# Patient Record
Sex: Female | Born: 1965 | Race: White | Hispanic: No | Marital: Married | Smoking: Former smoker
Health system: Southern US, Community
[De-identification: ages and names within clinical notes are randomized; demographics above are authoritative.]

## PROBLEM LIST (undated history)

## (undated) DIAGNOSIS — K219 Gastro-esophageal reflux disease without esophagitis: Secondary | ICD-10-CM

## (undated) DIAGNOSIS — H9191 Unspecified hearing loss, right ear: Secondary | ICD-10-CM

## (undated) DIAGNOSIS — M1990 Unspecified osteoarthritis, unspecified site: Secondary | ICD-10-CM

## (undated) DIAGNOSIS — E039 Hypothyroidism, unspecified: Secondary | ICD-10-CM

## (undated) HISTORY — PX: TONSILLECTOMY: SUR1361

---

## 1993-01-14 HISTORY — PX: CHOLECYSTECTOMY: SHX55

## 2008-08-02 ENCOUNTER — Emergency Department: Payer: Self-pay | Admitting: Emergency Medicine

## 2008-08-03 ENCOUNTER — Inpatient Hospital Stay: Payer: Self-pay | Admitting: Internal Medicine

## 2008-08-23 ENCOUNTER — Ambulatory Visit: Payer: Self-pay

## 2011-04-18 ENCOUNTER — Other Ambulatory Visit (HOSPITAL_COMMUNITY): Payer: Self-pay | Admitting: Family Medicine

## 2011-04-18 DIAGNOSIS — Z1231 Encounter for screening mammogram for malignant neoplasm of breast: Secondary | ICD-10-CM

## 2011-04-23 ENCOUNTER — Ambulatory Visit (HOSPITAL_COMMUNITY): Payer: Self-pay

## 2012-06-18 ENCOUNTER — Ambulatory Visit: Payer: Medicaid Other

## 2012-06-23 ENCOUNTER — Ambulatory Visit: Payer: Medicaid Other

## 2012-10-13 ENCOUNTER — Other Ambulatory Visit: Payer: Self-pay | Admitting: Orthopedic Surgery

## 2012-10-14 ENCOUNTER — Telehealth: Payer: Self-pay | Admitting: Vascular Surgery

## 2012-10-14 NOTE — Telephone Encounter (Signed)
LVM re: spine films and appt, sent letter - kf

## 2012-10-14 NOTE — Telephone Encounter (Signed)
Message copied by Margaretmary Eddy on Wed Oct 14, 2012  2:19 PM ------      Message from: Phillips Odor      Created: Wed Oct 14, 2012 12:15 PM      Regarding: needs consult appt. with TFE       Please schedule this pt. for consult appt. with Dr. Arbie Cookey; needs appt. prior to scheduled ALIF on 10/22 ; please remind pt. to bring CD ROM of the L-S spine films to the appt. with her.  ------

## 2012-10-22 ENCOUNTER — Other Ambulatory Visit: Payer: Self-pay

## 2012-10-26 ENCOUNTER — Encounter (HOSPITAL_COMMUNITY): Payer: Self-pay | Admitting: Pharmacy Technician

## 2012-10-27 ENCOUNTER — Encounter (HOSPITAL_COMMUNITY)
Admission: RE | Admit: 2012-10-27 | Discharge: 2012-10-27 | Disposition: A | Discharge status: Home / Self Care | Payer: Medicaid Other | Source: Ambulatory Visit | Attending: Orthopedic Surgery | Admitting: Orthopedic Surgery

## 2012-10-27 ENCOUNTER — Encounter (HOSPITAL_COMMUNITY): Payer: Self-pay

## 2012-10-27 DIAGNOSIS — Z01818 Encounter for other preprocedural examination: Secondary | ICD-10-CM | POA: Insufficient documentation

## 2012-10-27 DIAGNOSIS — Z01810 Encounter for preprocedural cardiovascular examination: Secondary | ICD-10-CM | POA: Insufficient documentation

## 2012-10-27 DIAGNOSIS — Z01812 Encounter for preprocedural laboratory examination: Secondary | ICD-10-CM | POA: Insufficient documentation

## 2012-10-27 HISTORY — DX: Unspecified osteoarthritis, unspecified site: M19.90

## 2012-10-27 HISTORY — DX: Unspecified hearing loss, right ear: H91.91

## 2012-10-27 HISTORY — DX: Hypothyroidism, unspecified: E03.9

## 2012-10-27 HISTORY — DX: Gastro-esophageal reflux disease without esophagitis: K21.9

## 2012-10-27 LAB — URINALYSIS, ROUTINE W REFLEX MICROSCOPIC
Bilirubin Urine: NEGATIVE
Glucose, UA: NEGATIVE mg/dL
Ketones, ur: NEGATIVE mg/dL
Leukocytes, UA: NEGATIVE
Nitrite: NEGATIVE
Protein, ur: NEGATIVE mg/dL
Specific Gravity, Urine: 1.018 (ref 1.005–1.030)
Urobilinogen, UA: 0.2 mg/dL (ref 0.0–1.0)
pH: 5 (ref 5.0–8.0)

## 2012-10-27 LAB — BASIC METABOLIC PANEL
CO2: 24 mEq/L (ref 19–32)
Calcium: 9.8 mg/dL (ref 8.4–10.5)
Creatinine, Ser: 0.71 mg/dL (ref 0.50–1.10)
GFR calc Af Amer: >90 mL/min (ref >90)
GFR calc non Af Amer: >90 mL/min (ref >90)
Glucose, Bld: 111 mg/dL — ABNORMAL HIGH (ref 70–99)
Sodium: 140 mEq/L (ref 135–145)

## 2012-10-27 LAB — PROTIME-INR
INR: 0.94 (ref 0.00–1.49)
Prothrombin Time: 12.4 s (ref 11.6–15.2)

## 2012-10-27 LAB — CBC
HCT: 42.2 % (ref 36.0–46.0)
Hemoglobin: 14.1 g/dL (ref 12.0–15.0)
MCH: 29.4 pg (ref 26.0–34.0)
MCHC: 33.4 g/dL (ref 30.0–36.0)
MCV: 88.1 fL (ref 78.0–100.0)
Platelets: 249 10*3/uL (ref 150–400)
RBC: 4.79 MIL/uL (ref 3.87–5.11)
WBC: 7.7 10*3/uL (ref 4.0–10.5)

## 2012-10-27 LAB — URINE MICROSCOPIC-ADD ON

## 2012-10-27 LAB — TYPE AND SCREEN
ABO/RH(D): O POS
Antibody Screen: NEGATIVE

## 2012-10-27 LAB — ABO/RH: ABO/RH(D): O POS

## 2012-10-27 LAB — APTT: aPTT: 34 seconds (ref 24–37)

## 2012-10-27 LAB — SURGICAL PCR SCREEN
MRSA, PCR: NEGATIVE
Staphylococcus aureus: POSITIVE — AB

## 2012-10-27 NOTE — Pre-Procedure Instructions (Signed)
Daniele Dillow  10/27/2012   Your procedure is scheduled on:  Wednesday, October 22nd.   Report to [facility]  2 * 3 at 6:30 AM.             [facility])  Call this number if you have problems the morning of surgery: [phone]   Remember:   Do not eat food or drink liquids after midnight Tuesday.   Take these medicines the morning of surgery with A SIP OF WATER: Norvasc, Levothyroxine, Ranitidine, Tramadol   Do not wear jewelry, make-up or nail polish.  Do not wear lotions, powders, or perfumes. You may wear deodorant.  Do not shave underarms & legs 48 hours prior to surgery.    Do not bring valuables to the hospital.  [facility] is not responsible for any belongings or valuables.               Contacts, dentures or bridgework may not be worn into surgery.  Leave suitcase in the car. After surgery it may be brought to your room.  For patients admitted to the hospital, discharge time is determined by your treatment team.              Name and phone number of your driver:    Special Instructions: Shower using CHG 2 nights before surgery and the night before surgery.  If you shower the day of surgery use CHG.  Use special wash - you have one bottle of CHG for all showers.  You should use approximately 1/3 of the bottle for each shower.   Please read over the following fact sheets that you were given: Coughing and Deep Breathing, Blood Transfusion Information, MRSA Information and Surgical Site Infection Prevention

## 2012-10-27 NOTE — Progress Notes (Addendum)
Denies any cardiac problem.   Did have shingles awhile ago.Marland KitchenMarland KitchenShe is deaf in right ear.  DA She goes to [employer] [phone] .  I tried calling with no one answering...Marland KitchenDA Trying to get an EKG for comparison.........still no answer @ 1617.

## 2012-10-28 NOTE — Progress Notes (Signed)
Anesthesia Chart Review:  Patient is a 47 year old female scheduled for L4-5 ALIF with Dr. Yevette Edwards and Dr. Arbie Cookey on 11/04/12.  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 PCP is with [facility].  Meds: amlodipine, Isopto tears, Synthroid, Zantac, Mobic, Ultram.  EKG on 10/27/12 showed NSR, possible LAe, incomplete right BB, inferior infarct (age undetermined), cannot rule out anterior infarct (age undetermined).  There are no prior EKGs at [facility] or in [place].  CXR report on 10/27/12 showed: Lungs are clear. Heart size and pulmonary vascularity are normal. No adenopathy. No bone lesions. There are surgical clips in the right upper quadrant of the abdomen.   Preoperative labs noted.  I reviewed above with EKG with anesthesiologist Dr. Gypsy Balsam.  Patient is 47 year old who is morbidly obese with a smoking history.  She is without known CAD/MI, CHF, DM, or HTN history.  She denied any cardiac issues, chest pain at her PAT visit. If there is no acute changes or new CV symptomology then it is anticipated that she can proceed as planned.    Velna Ochs [facility] Short Stay Center/Anesthesiology Phone [phone] 10/28/2012 4:49 PM

## 2012-11-02 ENCOUNTER — Encounter: Payer: Self-pay | Admitting: Vascular Surgery

## 2012-11-03 ENCOUNTER — Encounter (INDEPENDENT_AMBULATORY_CARE_PROVIDER_SITE_OTHER): Payer: Self-pay

## 2012-11-03 ENCOUNTER — Encounter: Payer: Self-pay | Admitting: Vascular Surgery

## 2012-11-03 ENCOUNTER — Ambulatory Visit (INDEPENDENT_AMBULATORY_CARE_PROVIDER_SITE_OTHER): Payer: Medicaid Other | Admitting: Vascular Surgery

## 2012-11-03 VITALS — BP 124/75 | HR 73 | Temp 97.7°F | Resp 16 | Ht 67.0 in | Wt 263.0 lb

## 2012-11-03 DIAGNOSIS — Z01810 Encounter for preprocedural cardiovascular examination: Secondary | ICD-10-CM

## 2012-11-03 MED ORDER — CEFAZOLIN SODIUM-DEXTROSE 2-3 GM-% IV SOLR
2.0000 g | INTRAVENOUS | Status: AC
  Administered 2012-11-04 (×2): 2 g via INTRAVENOUS
  Filled 2012-11-03: qty 50

## 2012-11-03 NOTE — Progress Notes (Signed)
[facility] [place]   Patient name: Ebony Reeves MRN: [id number] DOB: 07-23-1965 Sex: female   Referred by: Yevette Edwards  Reason for referral:  Chief Complaint  Patient presents with  . New Evaluation    Pre-op  for ALIF C/O back pain-19 yrs getting worse    HISTORY OF PRESENT ILLNESS: The patient presents today for discussion of spine surgery. She is a 47 year old female with a long history of progressive degenerative disc disease. She has been evaluated and felt appropriate for anterior exposure for interbody fusion and then the posterior instrumentation. She is here today for discussion of my role for exposure for anterior fusion. She has had a prior laparoscopic cholecystectomy in 1995 otherwise no abdominal surgery.  Past Medical History  Diagnosis Date  . GERD (gastroesophageal reflux disease)   . Arthritis   . Hypothyroidism   . Deafness in right ear     shingles & Ramsay Hunt Syndrome    Past Surgical History  Procedure Laterality Date  . Tonsillectomy    . Cholecystectomy  1995    Gall Bladder    History   Social History  . Marital Status: Legally Separated    Spouse Name: N/A    Number of Children: N/A  . Years of Education: N/A   Occupational History  . Not on file.   Social History Main Topics  . Smoking status: Former Smoker -- 0.50 packs/day for 25 years    Types: Cigarettes    Quit date: 11/03/2012  . Smokeless tobacco: Never Used  . Alcohol Use: No  . Drug Use: No  . Sexual Activity: Yes   Other Topics Concern  . Not on file   Social History Narrative  . No narrative on file    Family History  Problem Relation Age of Onset  . Cancer Mother     Bladder  . Diabetes Mother   . Hypertension Mother   . Hyperlipidemia Father   . Hypertension Father   . Diabetes Brother     Allergies as of 11/03/2012  . (No Known Allergies)    Current Outpatient Prescriptions on File Prior to Visit  Medication Sig Dispense Refill   . amLODipine (NORVASC) 10 MG tablet Take 10 mg by mouth daily.      . hydroxypropyl methylcellulose (ISOPTO TEARS) 2.5 % ophthalmic solution Place 2 drops into both eyes at bedtime as needed (for dry eyes).      Marland Kitchen levothyroxine (SYNTHROID, LEVOTHROID) 100 MCG tablet Take 100 mcg by mouth daily before breakfast.      . ranitidine (ZANTAC) 150 MG tablet Take 150 mg by mouth 2 (two) times daily.      . traMADol (ULTRAM) 50 MG tablet Take 50 mg by mouth 2 (two) times daily.      . meloxicam (MOBIC) 15 MG tablet Take 15 mg by mouth daily.       Current Facility-Administered Medications on File Prior to Visit  Medication Dose Route Frequency Provider Last Rate Last Dose  . [START ON 11/04/2012] ceFAZolin (ANCEF) IVPB 2 g/50 mL premix  2 g Intravenous On Call to OR Emilee Hero, MD         REVIEW OF SYSTEMS:  Positives indicated with an "X"  CARDIOVASCULAR:  [ ]  chest pain   [ ]  chest pressure   [ ]  palpitations   [ ]  orthopnea   [ ]  dyspnea on exertion   [ ]  claudication   [ ]  rest pain   [ ]   DVT   [ ]  phlebitis PULMONARY:   [ ]  productive cough   [ ]  asthma   [ ]  wheezing NEUROLOGIC:   [x ] weakness  [x ] paresthesias  [ ]  aphasia  [ ]  amaurosis  [ ]  dizziness HEMATOLOGIC:   [ ]  bleeding problems   [ ]  clotting disorders MUSCULOSKELETAL:  [ ]  joint pain   [ ]  joint swelling GASTROINTESTINAL: [ ]   blood in stool  [ ]   hematemesis GENITOURINARY:  [ ]   dysuria  [ ]   hematuria PSYCHIATRIC:  [ ]  history of major depression INTEGUMENTARY:  [ ]  rashes  [ ]  ulcers CONSTITUTIONAL:  [ ]  fever   [ ]  chills  PHYSICAL EXAMINATION:  General: The patient is a well-nourished female, in no acute distress. Vital signs are BP 124/75  Pulse 73  Temp(Src) 97.7 F (36.5 C) (Oral)  Resp 16  Ht 5\' 7"  (1.702 m)  Wt 263 lb (119.296 kg)  BMI 41.18 kg/m2  SpO2 99%  LMP 10/27/2008 Pulmonary: There is a good air exchange bilaterally without wheezing or rales. Abdomen: Soft and non-tender with  normal pitch bowel sounds. Moderate obesity Musculoskeletal: There are no major deformities.  There is no significant extremity pain. Neurologic: No focal weakness or paresthesias are detected, Skin: There are no ulcer or rashes noted. Psychiatric: The patient has normal affect. Cardiovascular: There is a regular rate and rhythm without significant murmur appreciated. Pulse status 2+ radial and 2+ dorsalis pedis pulses bilaterally   Impression and Plan:  L4-5 degenerative disc disease with plan for anterior and posterior fixation. I explained my role in exposure with mobilization of intraperitoneal contents, left ureter and vena cava aorta and iliac mobilization. I explained the potential for injury of all these. She understands and wished to proceed with the surgery as scheduled    Radhika Dershem [facility] [place] Office: [phone]

## 2012-11-04 ENCOUNTER — Inpatient Hospital Stay (HOSPITAL_COMMUNITY): Payer: Medicaid Other

## 2012-11-04 ENCOUNTER — Encounter (HOSPITAL_COMMUNITY): Payer: Self-pay | Admitting: Anesthesiology

## 2012-11-04 ENCOUNTER — Encounter (HOSPITAL_COMMUNITY)
Admission: RE | Disposition: A | Discharge status: Home / Self Care | Payer: Medicaid Other | Source: Ambulatory Visit | Attending: Orthopedic Surgery

## 2012-11-04 ENCOUNTER — Inpatient Hospital Stay (HOSPITAL_COMMUNITY)
Admission: RE | Admit: 2012-11-04 | Discharge: 2012-11-07 | DRG: 454 | Disposition: A | Discharge status: Home / Self Care | Payer: Medicaid Other | Source: Ambulatory Visit | Attending: Orthopedic Surgery | Admitting: Orthopedic Surgery

## 2012-11-04 ENCOUNTER — Inpatient Hospital Stay (HOSPITAL_COMMUNITY): Payer: Medicaid Other | Admitting: Anesthesiology

## 2012-11-04 ENCOUNTER — Encounter (HOSPITAL_COMMUNITY): Payer: Medicaid Other | Admitting: Vascular Surgery

## 2012-11-04 DIAGNOSIS — Q762 Congenital spondylolisthesis: Secondary | ICD-10-CM

## 2012-11-04 DIAGNOSIS — M129 Arthropathy, unspecified: Secondary | ICD-10-CM | POA: Diagnosis present

## 2012-11-04 DIAGNOSIS — M51379 Other intervertebral disc degeneration, lumbosacral region without mention of lumbar back pain or lower extremity pain: Principal | ICD-10-CM | POA: Diagnosis present

## 2012-11-04 DIAGNOSIS — M4310 Spondylolisthesis, site unspecified: Secondary | ICD-10-CM

## 2012-11-04 DIAGNOSIS — E039 Hypothyroidism, unspecified: Secondary | ICD-10-CM | POA: Diagnosis present

## 2012-11-04 DIAGNOSIS — Z87891 Personal history of nicotine dependence: Secondary | ICD-10-CM

## 2012-11-04 DIAGNOSIS — K219 Gastro-esophageal reflux disease without esophagitis: Secondary | ICD-10-CM | POA: Diagnosis present

## 2012-11-04 DIAGNOSIS — Z6841 Body Mass Index (BMI) 40.0 and over, adult: Secondary | ICD-10-CM

## 2012-11-04 DIAGNOSIS — M5137 Other intervertebral disc degeneration, lumbosacral region: Principal | ICD-10-CM | POA: Diagnosis present

## 2012-11-04 DIAGNOSIS — Z981 Arthrodesis status: Secondary | ICD-10-CM

## 2012-11-04 HISTORY — PX: ANTERIOR AND POSTERIOR SPINAL FUSION: SHX2259

## 2012-11-04 HISTORY — PX: ABDOMINAL EXPOSURE: SHX5708

## 2012-11-04 SURGERY — ANTERIOR AND POSTERIOR SPINAL FUSION
Anesthesia: General | Site: Spine Lumbar | Wound class: Clean

## 2012-11-04 MED ORDER — LEVOTHYROXINE SODIUM 100 MCG PO TABS
100.0000 ug | ORAL_TABLET | Freq: Every day | ORAL | Status: DC
  Administered 2012-11-05 – 2012-11-07 (×3): 100 ug via ORAL
  Filled 2012-11-04 (×4): qty 1

## 2012-11-04 MED ORDER — PROMETHAZINE HCL 25 MG/ML IJ SOLN
6.2500 mg | INTRAMUSCULAR | Status: DC | PRN
  Administered 2012-11-04: 6.25 mg via INTRAVENOUS

## 2012-11-04 MED ORDER — DIAZEPAM 5 MG PO TABS
ORAL_TABLET | ORAL | Status: AC
  Administered 2012-11-04: 5 mg via ORAL
  Filled 2012-11-04: qty 1

## 2012-11-04 MED ORDER — DEXTROSE 5 % IV SOLN
INTRAVENOUS | Status: DC | PRN
  Administered 2012-11-04: 14:00:00 via INTRAVENOUS

## 2012-11-04 MED ORDER — AMLODIPINE BESYLATE 10 MG PO TABS
10.0000 mg | ORAL_TABLET | Freq: Every day | ORAL | Status: DC
  Administered 2012-11-06 – 2012-11-07 (×2): 10 mg via ORAL
  Filled 2012-11-04 (×3): qty 1

## 2012-11-04 MED ORDER — DIPHENHYDRAMINE HCL 12.5 MG/5ML PO ELIX: 12.5000 mg | ORAL_SOLUTION | Freq: Four times a day (QID) | ORAL | Status: DC | PRN

## 2012-11-04 MED ORDER — MEPERIDINE HCL 25 MG/ML IJ SOLN: 6.2500 mg | INTRAMUSCULAR | Status: DC | PRN

## 2012-11-04 MED ORDER — ROCURONIUM BROMIDE 100 MG/10ML IV SOLN
INTRAVENOUS | Status: DC | PRN
  Administered 2012-11-04: 20 mg via INTRAVENOUS
  Administered 2012-11-04: 10 mg via INTRAVENOUS
  Administered 2012-11-04: 20 mg via INTRAVENOUS
  Administered 2012-11-04: 40 mg via INTRAVENOUS

## 2012-11-04 MED ORDER — OXYCODONE-ACETAMINOPHEN 5-325 MG PO TABS
1.0000 | ORAL_TABLET | ORAL | Status: DC | PRN
  Administered 2012-11-04: 2 via ORAL
  Administered 2012-11-05 (×2): 1 via ORAL
  Administered 2012-11-05 – 2012-11-07 (×7): 2 via ORAL
  Filled 2012-11-04 (×3): qty 2
  Filled 2012-11-04 (×2): qty 1
  Filled 2012-11-04 (×4): qty 2

## 2012-11-04 MED ORDER — SENNA 8.6 MG PO TABS
1.0000 | ORAL_TABLET | Freq: Two times a day (BID) | ORAL | Status: DC
Start: 2012-11-04 — End: 2012-11-07
  Administered 2012-11-04 – 2012-11-07 (×6): 8.6 mg via ORAL
  Filled 2012-11-04 (×8): qty 1

## 2012-11-04 MED ORDER — HYPROMELLOSE (GONIOSCOPIC) 2.5 % OP SOLN
2.0000 [drp] | Freq: Every evening | OPHTHALMIC | Status: DC | PRN
  Filled 2012-11-04: qty 15

## 2012-11-04 MED ORDER — PHENOL 1.4 % MT LIQD: 1.0000 | OROMUCOSAL | Status: DC | PRN

## 2012-11-04 MED ORDER — SODIUM CHLORIDE 0.9 % IJ SOLN: 3.0000 mL | INTRAMUSCULAR | Status: DC | PRN

## 2012-11-04 MED ORDER — MORPHINE SULFATE 2 MG/ML IJ SOLN: 2.0000 mg | INTRAMUSCULAR | Status: DC | PRN

## 2012-11-04 MED ORDER — HYDROMORPHONE HCL PF 1 MG/ML IJ SOLN
INTRAMUSCULAR | Status: AC
  Administered 2012-11-04: 0.5 mg via INTRAVENOUS
  Filled 2012-11-04: qty 1

## 2012-11-04 MED ORDER — DEXTROSE 5 % IV SOLN
2.0000 g | Freq: Once | INTRAVENOUS | Status: DC
  Filled 2012-11-04: qty 2000

## 2012-11-04 MED ORDER — MORPHINE SULFATE (PF) 1 MG/ML IV SOLN
INTRAVENOUS | Status: DC
  Administered 2012-11-04: 19.5 mg via INTRAVENOUS
  Administered 2012-11-04: 16:00:00 via INTRAVENOUS
  Administered 2012-11-04: 3.7 mg via INTRAVENOUS
  Administered 2012-11-05: 10.5 mg via INTRAVENOUS
  Filled 2012-11-04: qty 25

## 2012-11-04 MED ORDER — SODIUM CHLORIDE 0.9 % IJ SOLN: 9.0000 mL | INTRAMUSCULAR | Status: DC | PRN

## 2012-11-04 MED ORDER — SODIUM CHLORIDE 0.9 % IJ SOLN
2000.0000 ug | INTRAVENOUS | Status: DC | PRN
  Administered 2012-11-04: .5 ug/kg/min via INTRAVENOUS

## 2012-11-04 MED ORDER — SODIUM CHLORIDE 0.9 % IJ SOLN
3.0000 mL | Freq: Two times a day (BID) | INTRAMUSCULAR | Status: DC
  Administered 2012-11-04 – 2012-11-07 (×4): 3 mL via INTRAVENOUS

## 2012-11-04 MED ORDER — ONDANSETRON HCL 4 MG/2ML IJ SOLN: 4.0000 mg | Freq: Four times a day (QID) | INTRAMUSCULAR | Status: DC | PRN

## 2012-11-04 MED ORDER — MENTHOL 3 MG MT LOZG: 1.0000 | LOZENGE | OROMUCOSAL | Status: DC | PRN

## 2012-11-04 MED ORDER — ACETAMINOPHEN 650 MG RE SUPP: 650.0000 mg | RECTAL | Status: DC | PRN

## 2012-11-04 MED ORDER — LACTATED RINGERS IV SOLN
INTRAVENOUS | Status: DC | PRN
  Administered 2012-11-04 (×2): via INTRAVENOUS

## 2012-11-04 MED ORDER — DOCUSATE SODIUM 100 MG PO CAPS
100.0000 mg | ORAL_CAPSULE | Freq: Two times a day (BID) | ORAL | Status: DC
  Administered 2012-11-04 – 2012-11-07 (×6): 100 mg via ORAL
  Filled 2012-11-04 (×4): qty 1

## 2012-11-04 MED ORDER — THROMBIN 20000 UNITS EX SOLR
CUTANEOUS | Status: DC | PRN
  Administered 2012-11-04: 10:00:00

## 2012-11-04 MED ORDER — DIAZEPAM 5 MG PO TABS
5.0000 mg | ORAL_TABLET | Freq: Four times a day (QID) | ORAL | Status: DC | PRN
  Administered 2012-11-04 – 2012-11-07 (×8): 5 mg via ORAL
  Filled 2012-11-04 (×7): qty 1

## 2012-11-04 MED ORDER — MIDAZOLAM HCL 5 MG/5ML IJ SOLN
INTRAMUSCULAR | Status: DC | PRN
  Administered 2012-11-04: 1.5 mg via INTRAVENOUS
  Administered 2012-11-04: 0.5 mg via INTRAVENOUS

## 2012-11-04 MED ORDER — BUPIVACAINE-EPINEPHRINE 0.25% -1:200000 IJ SOLN
INTRAMUSCULAR | Status: DC | PRN
  Administered 2012-11-04: 24 mL

## 2012-11-04 MED ORDER — BUPIVACAINE-EPINEPHRINE PF 0.25-1:200000 % IJ SOLN
INTRAMUSCULAR | Status: AC
  Filled 2012-11-04: qty 30

## 2012-11-04 MED ORDER — ONDANSETRON HCL 4 MG/2ML IJ SOLN
INTRAMUSCULAR | Status: DC | PRN
  Administered 2012-11-04: 4 mg via INTRAVENOUS

## 2012-11-04 MED ORDER — ALUM & MAG HYDROXIDE-SIMETH 200-200-20 MG/5ML PO SUSP
30.0000 mL | Freq: Four times a day (QID) | ORAL | Status: DC | PRN
  Administered 2012-11-06: 30 mL via ORAL
  Filled 2012-11-04 (×2): qty 30

## 2012-11-04 MED ORDER — NEOSTIGMINE METHYLSULFATE 1 MG/ML IJ SOLN
INTRAMUSCULAR | Status: DC | PRN
  Administered 2012-11-04: 4 mg via INTRAVENOUS

## 2012-11-04 MED ORDER — ACETAMINOPHEN 325 MG PO TABS: 650.0000 mg | ORAL_TABLET | ORAL | Status: DC | PRN

## 2012-11-04 MED ORDER — PHENYLEPHRINE HCL 10 MG/ML IJ SOLN
10.0000 mg | INTRAVENOUS | Status: DC | PRN
  Administered 2012-11-04: 20 ug/min via INTRAVENOUS

## 2012-11-04 MED ORDER — LACTATED RINGERS IV SOLN
INTRAVENOUS | Status: DC | PRN
  Administered 2012-11-04 (×2): via INTRAVENOUS

## 2012-11-04 MED ORDER — SODIUM CHLORIDE 0.9 % IV SOLN: 250.0000 mL | INTRAVENOUS | Status: DC

## 2012-11-04 MED ORDER — POLYVINYL ALCOHOL 1.4 % OP SOLN
2.0000 [drp] | Freq: Every evening | OPHTHALMIC | Status: DC | PRN
  Administered 2012-11-04: 2 [drp] via OPHTHALMIC
  Filled 2012-11-04: qty 15

## 2012-11-04 MED ORDER — PHENYLEPHRINE HCL 10 MG/ML IJ SOLN
INTRAMUSCULAR | Status: DC | PRN
  Administered 2012-11-04 (×3): 40 ug via INTRAVENOUS

## 2012-11-04 MED ORDER — THROMBIN 20000 UNITS EX SOLR
CUTANEOUS | Status: AC
  Filled 2012-11-04: qty 40000

## 2012-11-04 MED ORDER — HYDROMORPHONE HCL PF 1 MG/ML IJ SOLN
0.2500 mg | INTRAMUSCULAR | Status: DC | PRN
  Administered 2012-11-04 (×4): 0.5 mg via INTRAVENOUS

## 2012-11-04 MED ORDER — FENTANYL CITRATE 0.05 MG/ML IJ SOLN
INTRAMUSCULAR | Status: DC | PRN
  Administered 2012-11-04 (×2): 50 ug via INTRAVENOUS
  Administered 2012-11-04: 100 ug via INTRAVENOUS
  Administered 2012-11-04: 50 ug via INTRAVENOUS
  Administered 2012-11-04: 100 ug via INTRAVENOUS

## 2012-11-04 MED ORDER — CHLORHEXIDINE GLUCONATE 4 % EX LIQD: 60.0000 mL | Freq: Once | CUTANEOUS | Status: DC

## 2012-11-04 MED ORDER — PROMETHAZINE HCL 25 MG/ML IJ SOLN
INTRAMUSCULAR | Status: AC
  Administered 2012-11-04: 6.25 mg via INTRAVENOUS
  Filled 2012-11-04: qty 1

## 2012-11-04 MED ORDER — GLYCOPYRROLATE 0.2 MG/ML IJ SOLN
INTRAMUSCULAR | Status: DC | PRN
  Administered 2012-11-04: 0.2 mg via INTRAVENOUS
  Administered 2012-11-04: 0.6 mg via INTRAVENOUS

## 2012-11-04 MED ORDER — MORPHINE SULFATE (PF) 1 MG/ML IV SOLN
INTRAVENOUS | Status: AC
  Filled 2012-11-04: qty 25

## 2012-11-04 MED ORDER — LIDOCAINE HCL (CARDIAC) 20 MG/ML IV SOLN
INTRAVENOUS | Status: DC | PRN
  Administered 2012-11-04: 100 mg via INTRAVENOUS

## 2012-11-04 MED ORDER — OXYCODONE-ACETAMINOPHEN 5-325 MG PO TABS
ORAL_TABLET | ORAL | Status: AC
  Administered 2012-11-04: 2 via ORAL
  Filled 2012-11-04: qty 2

## 2012-11-04 MED ORDER — CEFAZOLIN SODIUM 1-5 GM-% IV SOLN
1.0000 g | Freq: Three times a day (TID) | INTRAVENOUS | Status: AC
  Administered 2012-11-04 – 2012-11-05 (×2): 1 g via INTRAVENOUS
  Filled 2012-11-04 (×2): qty 50

## 2012-11-04 MED ORDER — FAMOTIDINE 20 MG PO TABS
20.0000 mg | ORAL_TABLET | Freq: Every day | ORAL | Status: DC
  Administered 2012-11-05 – 2012-11-07 (×3): 20 mg via ORAL
  Filled 2012-11-04 (×3): qty 1

## 2012-11-04 MED ORDER — ONDANSETRON HCL 4 MG/2ML IJ SOLN: 4.0000 mg | INTRAMUSCULAR | Status: DC | PRN

## 2012-11-04 MED ORDER — NALOXONE HCL 0.4 MG/ML IJ SOLN: 0.4000 mg | INTRAMUSCULAR | Status: DC | PRN

## 2012-11-04 MED ORDER — POTASSIUM CHLORIDE IN NACL 20-0.9 MEQ/L-% IV SOLN
INTRAVENOUS | Status: DC
  Administered 2012-11-04: 20:00:00 via INTRAVENOUS
  Filled 2012-11-04 (×7): qty 1000

## 2012-11-04 MED ORDER — DEXTROSE 5 % IV SOLN
INTRAVENOUS | Status: DC | PRN
  Administered 2012-11-04: 09:00:00 via INTRAVENOUS

## 2012-11-04 MED ORDER — PROPOFOL INFUSION 10 MG/ML OPTIME
INTRAVENOUS | Status: DC | PRN
  Administered 2012-11-04: 25 ug/kg/min via INTRAVENOUS

## 2012-11-04 MED ORDER — SUCCINYLCHOLINE CHLORIDE 20 MG/ML IJ SOLN
INTRAMUSCULAR | Status: DC | PRN
  Administered 2012-11-04: 110 mg via INTRAVENOUS

## 2012-11-04 MED ORDER — DIPHENHYDRAMINE HCL 50 MG/ML IJ SOLN: 12.5000 mg | Freq: Four times a day (QID) | INTRAMUSCULAR | Status: DC | PRN

## 2012-11-04 MED ORDER — PROPOFOL 10 MG/ML IV BOLUS
INTRAVENOUS | Status: DC | PRN
  Administered 2012-11-04: 30 mg via INTRAVENOUS
  Administered 2012-11-04: 250 mg via INTRAVENOUS

## 2012-11-04 SURGICAL SUPPLY — 121 items
APPLIER CLIP 11 MED OPEN (CLIP)
BENZOIN TINCTURE PRP APPL 2/3 (GAUZE/BANDAGES/DRESSINGS) ×6 IMPLANT
BIT DRILL QC 3.2 195 (BIT)
BIT DRILL QC 3.2 195MM (BIT) IMPLANT
BLADE SURG 10 STRL SS (BLADE) ×3 IMPLANT
BLADE SURG ROTATE 9660 (MISCELLANEOUS) IMPLANT
CAGE COUGAR ALIF MED 5 10 (Cage) ×3 IMPLANT
CLIP APPLIE 11 MED OPEN (CLIP) IMPLANT
CLIP LIGATING EXTRA MED SLVR (CLIP) ×3 IMPLANT
CLIP LIGATING EXTRA SM BLUE (MISCELLANEOUS) ×3 IMPLANT
CLOTH BEACON ORANGE TIMEOUT ST (SAFETY) ×3 IMPLANT
CLSR STERI-STRIP ANTIMIC 1/2X4 (GAUZE/BANDAGES/DRESSINGS) ×6 IMPLANT
CONT SPEC STER OR (MISCELLANEOUS) ×3 IMPLANT
CORDS BIPOLAR (ELECTRODE) ×6 IMPLANT
COUNTER NEEDLE 20 DBL MAG RED (NEEDLE) ×3 IMPLANT
COVER MAYO STAND STRL (DRAPES) IMPLANT
COVER SURGICAL LIGHT HANDLE (MISCELLANEOUS) ×6 IMPLANT
DERMABOND ADVANCED (GAUZE/BANDAGES/DRESSINGS) ×1
DERMABOND ADVANCED .7 DNX12 (GAUZE/BANDAGES/DRESSINGS) ×2 IMPLANT
DRAIN CHANNEL 15F RND FF W/TCR (WOUND CARE) IMPLANT
DRAPE C-ARM 42X72 X-RAY (DRAPES) ×9 IMPLANT
DRAPE INCISE IOBAN 66X45 STRL (DRAPES) ×9 IMPLANT
DRAPE POUCH INSTRU U-SHP 10X18 (DRAPES) ×6 IMPLANT
DRAPE PROXIMA HALF (DRAPES) ×3 IMPLANT
DRAPE SURG 17X23 STRL (DRAPES) ×21 IMPLANT
DRILL BIT QC 3.2 195MM (BIT)
DRSG MEPILEX BORDER 4X8 (GAUZE/BANDAGES/DRESSINGS) ×3 IMPLANT
DURAPREP 26ML APPLICATOR (WOUND CARE) ×6 IMPLANT
ELECT BLADE 4.0 EZ CLEAN MEGAD (MISCELLANEOUS) ×3
ELECT CAUTERY BLADE 6.4 (BLADE) ×6 IMPLANT
ELECT REM PT RETURN 9FT ADLT (ELECTROSURGICAL) ×6
ELECTRODE BLDE 4.0 EZ CLN MEGD (MISCELLANEOUS) ×2 IMPLANT
ELECTRODE REM PT RTRN 9FT ADLT (ELECTROSURGICAL) ×4 IMPLANT
EVACUATOR 1/8 PVC DRAIN (DRAIN) IMPLANT
GAUZE SPONGE 4X4 16PLY XRAY LF (GAUZE/BANDAGES/DRESSINGS) ×9 IMPLANT
GLOVE BIO SURGEON STRL SZ 6.5 (GLOVE) ×3 IMPLANT
GLOVE BIO SURGEON STRL SZ7 (GLOVE) ×6 IMPLANT
GLOVE BIO SURGEON STRL SZ8 (GLOVE) ×6 IMPLANT
GLOVE BIOGEL PI IND STRL 6.5 (GLOVE) ×2 IMPLANT
GLOVE BIOGEL PI IND STRL 8 (GLOVE) ×2 IMPLANT
GLOVE BIOGEL PI INDICATOR 6.5 (GLOVE) ×1
GLOVE BIOGEL PI INDICATOR 8 (GLOVE) ×1
GLOVE SS BIOGEL STRL SZ 7.5 (GLOVE) ×6 IMPLANT
GLOVE SUPERSENSE BIOGEL SZ 7.5 (GLOVE) ×3
GOWN PREVENTION PLUS XLARGE (GOWN DISPOSABLE) ×12 IMPLANT
GOWN STRL NON-REIN LRG LVL3 (GOWN DISPOSABLE) ×21 IMPLANT
GUIDEWIRE SHARP VIPER II (WIRE) ×12 IMPLANT
INSERT FOGARTY 61MM (MISCELLANEOUS) IMPLANT
INSERT FOGARTY SM (MISCELLANEOUS) IMPLANT
KIT BASIN OR (CUSTOM PROCEDURE TRAY) ×3 IMPLANT
KIT POSITION SURG JACKSON T1 (MISCELLANEOUS) ×3 IMPLANT
KIT ROOM TURNOVER OR (KITS) ×6 IMPLANT
LOOP VESSEL MAXI BLUE (MISCELLANEOUS) IMPLANT
LOOP VESSEL MINI RED (MISCELLANEOUS) IMPLANT
MARKER SKIN DUAL TIP RULER LAB (MISCELLANEOUS) ×3 IMPLANT
MIX DBX 10CC 35% BONE (Bone Implant) ×3 IMPLANT
NEEDLE 22X1 1/2 (OR ONLY) (NEEDLE) ×3 IMPLANT
NEEDLE HYPO 25GX1X1/2 BEV (NEEDLE) ×6 IMPLANT
NEEDLE JAMSHIDI VIPER (NEEDLE) ×9 IMPLANT
NEEDLE SPNL 18GX3.5 QUINCKE PK (NEEDLE) IMPLANT
NEEDLE SPNL 22GX3.5 QUINCKE BK (NEEDLE) ×3 IMPLANT
NEURO MONITORING STIM (LABOR (TRAVEL & OVERTIME)) ×3 IMPLANT
NS IRRIG 1000ML POUR BTL (IV SOLUTION) ×3 IMPLANT
PACK LAMINECTOMY ORTHO (CUSTOM PROCEDURE TRAY) ×3 IMPLANT
PACK UNIVERSAL I (CUSTOM PROCEDURE TRAY) ×6 IMPLANT
PAD ARMBOARD 7.5X6 YLW CONV (MISCELLANEOUS) ×12 IMPLANT
PAD SHARPS MAGNETIC DISPOSAL (MISCELLANEOUS) IMPLANT
PATTIES SURGICAL .5 X1 (DISPOSABLE) IMPLANT
PATTIES SURGICAL .75X.75 (GAUZE/BANDAGES/DRESSINGS) IMPLANT
PENCIL BUTTON HOLSTER BLD 10FT (ELECTRODE) ×6 IMPLANT
ROD VIPER II LORDOSED 5.5X40 (Rod) ×3 IMPLANT
ROD VIPER2 5.5X45 PRE LARDOSED (Rod) ×3 IMPLANT
SCREW SET SINGLE INNER MIS (Screw) ×12 IMPLANT
SCREW XTAB POLY VIPER  6X45 (Screw) ×4 IMPLANT
SCREW XTAB POLY VIPER 6X45 (Screw) ×8 IMPLANT
SPONGE GAUZE 4X4 12PLY (GAUZE/BANDAGES/DRESSINGS) ×6 IMPLANT
SPONGE INTESTINAL PEANUT (DISPOSABLE) ×12 IMPLANT
SPONGE LAP 18X18 X RAY DECT (DISPOSABLE) ×3 IMPLANT
SPONGE LAP 4X18 X RAY DECT (DISPOSABLE) ×3 IMPLANT
SPONGE SURGIFOAM ABS GEL 100 (HEMOSTASIS) ×3 IMPLANT
STAPLER VISISTAT 35W (STAPLE) IMPLANT
STRIP CLOSURE SKIN 1/2X4 (GAUZE/BANDAGES/DRESSINGS) ×12 IMPLANT
SURGIFLO TRUKIT (HEMOSTASIS) IMPLANT
SUT ETHILON 2 0 FS 18 (SUTURE) ×3 IMPLANT
SUT MNCRL AB 4-0 PS2 18 (SUTURE) ×3 IMPLANT
SUT MON AB 3-0 SH 27 (SUTURE) ×1
SUT MON AB 3-0 SH27 (SUTURE) ×2 IMPLANT
SUT PROLENE 4 0 RB 1 (SUTURE) ×4
SUT PROLENE 4-0 RB1 .5 CRCL 36 (SUTURE) ×8 IMPLANT
SUT PROLENE 5 0 CC1 (SUTURE) ×6 IMPLANT
SUT PROLENE 6 0 C 1 30 (SUTURE) ×3 IMPLANT
SUT PROLENE 6 0 CC (SUTURE) IMPLANT
SUT SILK 0 TIES 10X30 (SUTURE) ×3 IMPLANT
SUT SILK 2 0 TIES 10X30 (SUTURE) ×6 IMPLANT
SUT SILK 2 0SH CR/8 30 (SUTURE) IMPLANT
SUT SILK 3 0 TIES 10X30 (SUTURE) ×6 IMPLANT
SUT SILK 3 0SH CR/8 30 (SUTURE) IMPLANT
SUT VIC AB 0 CT1 18XCR BRD 8 (SUTURE) ×4 IMPLANT
SUT VIC AB 0 CT1 27 (SUTURE) ×2
SUT VIC AB 0 CT1 27XBRD ANBCTR (SUTURE) ×4 IMPLANT
SUT VIC AB 0 CT1 8-18 (SUTURE) ×2
SUT VIC AB 1 CT1 18XCR BRD 8 (SUTURE) ×8 IMPLANT
SUT VIC AB 1 CT1 8-18 (SUTURE) ×4
SUT VIC AB 1 CTX 36 (SUTURE) ×2
SUT VIC AB 1 CTX36XBRD ANBCTR (SUTURE) ×4 IMPLANT
SUT VIC AB 2-0 CT1 18 (SUTURE) ×3 IMPLANT
SUT VIC AB 2-0 CT1 36 (SUTURE) ×3 IMPLANT
SUT VIC AB 2-0 CT2 18 VCP726D (SUTURE) ×3 IMPLANT
SUT VIC AB 3-0 SH 27 (SUTURE) ×1
SUT VIC AB 3-0 SH 27X BRD (SUTURE) ×2 IMPLANT
SUT VIC AB 3-0 SH 27XBRD (SUTURE) IMPLANT
SYR BULB IRRIGATION 50ML (SYRINGE) ×3 IMPLANT
SYR CONTROL 10ML LL (SYRINGE) ×6 IMPLANT
TAP CANN VIPER2 DL 5.0 (TAP) ×6 IMPLANT
TAPE CLOTH SURG 4X10 WHT LF (GAUZE/BANDAGES/DRESSINGS) ×3 IMPLANT
TAPE CLOTH SURG 6X10 WHT LF (GAUZE/BANDAGES/DRESSINGS) ×3 IMPLANT
TOWEL OR 17X24 6PK STRL BLUE (TOWEL DISPOSABLE) ×6 IMPLANT
TOWEL OR 17X26 10 PK STRL BLUE (TOWEL DISPOSABLE) ×9 IMPLANT
TRAY FOLEY CATH 14FRSI W/METER (CATHETERS) ×3 IMPLANT
WATER STERILE IRR 1000ML POUR (IV SOLUTION) ×3 IMPLANT
YANKAUER SUCT BULB TIP NO VENT (SUCTIONS) ×3 IMPLANT

## 2012-11-04 NOTE — Preoperative (Signed)
Beta Blockers   Reason not to administer Beta Blockers:Not Applicable 

## 2012-11-04 NOTE — Op Note (Signed)
OPERATIVE REPORT  DATE OF SURGERY: 11/04/2012  PATIENT: Ebony Reeves, 47 y.o. female MRN: [id number]  DOB: July 21, 1965  PRE-OPERATIVE DIAGNOSIS: L4-5 degenerative disc disease  POST-OPERATIVE DIAGNOSIS:  Same  PROCEDURE: Anterior exposure for L4-5 interbody fusion  SURGEON:  Gretta Began, M.D.  Co-surgeon for the exposure: Estill Bamberg  ANESTHESIA:  Gen.  EBL: 250 ml  Total I/O In: 1050 [I.V.:1050] Out: 435 [Urine:185; Blood:250]  BLOOD ADMINISTERED: None  DRAINS: None   COUNTS CORRECT:  YES  PLAN OF CARE: PACU   PATIENT DISPOSITION:  PACU - hemodynamically stable  PROCEDURE DETAILS: The patient was taken down and placed supine position where the area of the abdomen was imaged with cross table C-arm. This revealed the level of the L4-5 disc. The abdomen was prepped and draped in usual sterile fashion. Incision was made from midline to the left. The patient is morbidly obese and had a great deal of subcutaneous fat. This was divided with cautery in line with the skin incision. The fascia was reached and the fat was mobilized off the fascia. The fascia was opened electrocautery in line with the skin incision in the rectus muscle was mobilized circumferentially. The retroperitoneal space was opened laterally and dissection bluntly was used to mobilize intraperitoneal contents to the right. The left ureter was identified and preserved. Dissection was carried to the psoas muscle. Mobilization continued in the posterior rectus sheath was opened laterally at the level of the semilunar line and proximally. This allowed for better I continued mobilization of intraperitoneal contents to the right. The iliac vessels on the left were identified and the patient had 2 iliolumbar veins which were divided after ligating them. Blunt dissection was used to mobilize the iliac vessels to the right. The L4-5 disc was identified. Blunt dissection was continued to fully mobilize the space to the right and  left of the disc. The Thompson retractor was brought onto the field and the reverse lip 200 cm blade was positioned to the left and right of the L4-5 disc. The malleable 190 cm blade was positioned superiorly and inferiorly for exposure. The remainder of the procedure will be dictated as a separate note by Dr. Yevette Edwards.   Gretta Began, M.D. 11/04/2012 1:58 PM

## 2012-11-04 NOTE — Progress Notes (Signed)
Bair hugger applied. Warm blankets wrapped around pt.'s head.

## 2012-11-04 NOTE — Progress Notes (Addendum)
Pt arrived on unit at 1815 from pacu via bed with family in room waiting on pt. Pt A&O x4, c/o coldness and warm blanket and heat packs given; foley intact and draining. PCA going with pt pain at 1. Incision dry and intact  With no stain or drainage noted. Pt offered the PNA and FLU vaccine but says she will have to think about. Pt presently comfortable in bed with family at bedside. Will continue to monitor quietly; Incoming RN notified. Rodell Perna Dovey Fatzinger RN.

## 2012-11-04 NOTE — Anesthesia Preprocedure Evaluation (Addendum)
Anesthesia Evaluation  Patient identified by MRN, date of birth, ID band Patient awake    Reviewed: Allergy & Precautions, H&P , NPO status , Patient's Chart, lab work & pertinent test results  Airway Mallampati: II TM Distance: >3 FB Neck ROM: Full    Dental  (+) Dental Advisory Given   Pulmonary neg pulmonary ROS,  breath sounds clear to auscultation        Cardiovascular hypertension, Pt. 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 Some R sided facial weakness negative neurological ROS  negative psych ROS   GI/Hepatic Neg liver ROS, GERD-  Medicated,  Endo/Other  Hypothyroidism Morbid obesity  Renal/GU negative Renal ROS     Musculoskeletal negative musculoskeletal ROS (+)   Abdominal (+) + obese,   Peds  Hematology negative hematology ROS (+)   Anesthesia Other Findings   Reproductive/Obstetrics negative OB ROS                          Anesthesia Physical Anesthesia Plan  ASA: III  Anesthesia Plan: General   Post-op Pain Management:    Induction: Intravenous  Airway Management Planned: Oral ETT  Additional Equipment: Arterial line  Intra-op Plan:   Post-operative Plan: Extubation in OR  Informed Consent: I have reviewed the patients History and Physical, chart, labs and discussed the procedure including the risks, benefits and alternatives for the proposed anesthesia with the patient or authorized representative who has indicated his/her understanding and acceptance.   Dental advisory given  Plan Discussed with: CRNA  Anesthesia Plan Comments: (2x PIV)       Anesthesia Quick Evaluation

## 2012-11-04 NOTE — H&P (Signed)
PREOPERATIVE H&P  Chief Complaint: bilateral leg pain  HPI: Ebony Reeves is a 46 y.o. female who presents with ongoing bilateral leg pain. 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 Patient failed extensive conservative care.  Past Medical History  Diagnosis Date  . GERD (gastroesophageal reflux disease)   . Arthritis   . Hypothyroidism   . Deafness in right ear     shingles & Ramsay Hunt Syndrome   Past Surgical History  Procedure Laterality Date  . Tonsillectomy    . Cholecystectomy  1995    Gall Bladder   History   Social History  . Marital Status: Legally Separated    Spouse Name: N/A    Number of Children: N/A  . Years of Education: N/A   Social History Main Topics  . Smoking status: Former Smoker -- 0.50 packs/day for 25 years    Types: Cigarettes    Quit date: 11/03/2012  . Smokeless tobacco: Never Used  . Alcohol Use: No  . Drug Use: No  . Sexual Activity: Yes   Other Topics Concern  . Not on file   Social History Narrative  . No narrative on file   Family History  Problem Relation Age of Onset  . Cancer Mother     Bladder  . Diabetes Mother   . Hypertension Mother   . Hyperlipidemia Father   . Hypertension Father   . Diabetes Brother    No Known Allergies Prior to Admission medications   Medication Sig Start Date End Date Taking? Authorizing Provider  amLODipine (NORVASC) 10 MG tablet Take 10 mg by mouth daily.   Yes Historical Provider, MD  hydroxypropyl methylcellulose (ISOPTO TEARS) 2.5 % ophthalmic solution Place 2 drops into both eyes at bedtime as needed (for dry eyes).   Yes Historical Provider, MD  levothyroxine (SYNTHROID, LEVOTHROID) 100 MCG tablet Take 100 mcg by mouth daily before breakfast.   Yes Historical Provider, MD  meloxicam (MOBIC) 15 MG tablet Take 15 mg by mouth daily.   Yes Historical Provider, MD  ranitidine (ZANTAC) 150 MG tablet Take 150 mg by mouth 2 (two) times daily.   Yes Historical  Provider, MD  traMADol (ULTRAM) 50 MG tablet Take 50 mg by mouth 2 (two) times daily.   Yes Historical Provider, MD     All other systems have been reviewed and were otherwise negative with the exception of those mentioned in the HPI and as above.  Physical Exam: Filed Vitals:   11/04/12 0659  BP: 155/91  Pulse: 85  Temp: 96.9 F (36.1 C)  Resp: 18    General: Alert, no acute distress Cardiovascular: No pedal edema Respiratory: No cyanosis, no use of accessory musculature GI: No organomegaly, abdomen is soft and non-tender Skin: No lesions in the area of chief complaint Neurologic: Sensation intact distally Psychiatric: Patient is competent for consent with normal mood and affect Lymphatic: No axillary or cervical lymphadenopathy   Assessment/Plan: Bilateral leg pain Plan for Procedure(s): Lumbar 4-5 anterior lumbar interbody fusion with instrumentation and allograft; Posterior spinal fusion with instrumentation   Emilee Hero, MD 11/04/2012 8:10 AM

## 2012-11-04 NOTE — Anesthesia Postprocedure Evaluation (Signed)
Anesthesia Post Note  Patient: Ebony Reeves  Procedure(s) Performed: Procedure(s) (LRB): Lumbar 4-5 anterior lumbar interbody fusion with instrumentation and allograft; Posterior spinal fusion with instrumentation (N/A) ABDOMINAL EXPOSURE (N/A)  Anesthesia type: General  Patient location: PACU  Post pain: Pain level controlled  Post assessment: Post-op Vital signs reviewed  Last Vitals: BP 130/64  Pulse 62  Temp(Src) 33.9 C (Oral)  Resp 16  SpO2 100%  Post vital signs: Reviewed  Level of consciousness: sedated  Complications: No apparent anesthesia complications

## 2012-11-04 NOTE — Transfer of Care (Signed)
Immediate Anesthesia Transfer of Care Note  Patient: Ebony Reeves  Procedure(s) Performed: Procedure(s) with comments: Lumbar 4-5 anterior lumbar interbody fusion with instrumentation and allograft; Posterior spinal fusion with instrumentation (N/A) - Lumbar 4-5 anterior lumbar interbody fusion with instrumentation and allograft; Posterior spinal fusion with instrumentation ABDOMINAL EXPOSURE (N/A)  Patient Location: PACU  Anesthesia Type:General  Level of Consciousness: awake, alert  and patient cooperative  Airway & Oxygen Therapy: Patient Spontanous Breathing and Patient connected to face mask oxygen  Post-op Assessment: Report given to PACU RN, Post -op Vital signs reviewed and stable and Patient moving all extremities  Post vital signs: Reviewed and stable  Complications: No apparent anesthesia complications

## 2012-11-04 NOTE — Anesthesia Procedure Notes (Signed)
Procedure Name: Intubation Date/Time: 11/04/2012 8:49 AM Performed by: Darcey Nora B Pre-anesthesia Checklist: Patient identified, Patient being monitored, Emergency Drugs available and Suction available Patient Re-evaluated:Patient Re-evaluated prior to inductionOxygen Delivery Method: Circle system utilized Preoxygenation: Pre-oxygenation with 100% oxygen Intubation Type: IV induction Ventilation: Mask ventilation without difficulty Laryngoscope Size: Mac and 3 Grade View: Grade II Tube type: Oral Tube size: 7.5 mm Number of attempts: 1 Airway Equipment and Method: Video-laryngoscopy and Stylet Placement Confirmation: ETT inserted through vocal cords under direct vision,  breath sounds checked- equal and bilateral and positive ETCO2 Secured at: 21 (cm at teeth) cm Tube secured with: Tape Dental Injury: Teeth and Oropharynx as per pre-operative assessment

## 2012-11-05 ENCOUNTER — Encounter (HOSPITAL_COMMUNITY): Payer: Self-pay | Admitting: Orthopedic Surgery

## 2012-11-05 MED FILL — Sodium Chloride IV Soln 0.9%: INTRAVENOUS | Qty: 1000 | Status: AC

## 2012-11-05 MED FILL — Heparin Sodium (Porcine) Inj 1000 Unit/ML: INTRAMUSCULAR | Qty: 30 | Status: AC

## 2012-11-05 NOTE — Progress Notes (Signed)
Patient doing well, minimal pain in low back. No leg pain. Slept well last night. Patient is very happy and appreciative of her care.  BP 103/57  Pulse 86  Temp(Src) 98.2 F (36.8 C) (Oral)  Resp 16  SpO2 93%  NVI Dressing CDI  POD #1 s/p A/P L4/5 decompression and fusion, doing well very  - up with PT and OT today - brace when up and ambulating - d/c PCA and foley - percocet and valium

## 2012-11-05 NOTE — Progress Notes (Signed)
Pt refused foley removal around noon. PCA d/c'd this morning. Pt used 14.87 of morphine syringe when history was cleared. Pt still had not been up and walking as of 1900. Pt later agreed to foley removal and foley was removed at 1935. Pt tolerated it well. RN will continue to monitor.

## 2012-11-05 NOTE — Progress Notes (Signed)
UR complete.  Axiel Fjeld RN, MSN 

## 2012-11-05 NOTE — Progress Notes (Signed)
Patient ID: Ebony Reeves, female   DOB: [birth year], 47 y.o.   MRN: [id number] The patient is comfortable this morning. Reports some stiffness in her back in minimal abdominal discomfort. Reports some soreness with coughing in her abdomen. No nausea. Actually slept well last night.  On physical exam, her abdominal incision dressing is intact and dry. Abdomen is soft and nontender. 2+ dorsalis pedis pulses bilaterally.  Impression and plan: Stable postop day 1 from anterior and posterior fusion. Will not follow actively. Please call if we can assist.

## 2012-11-05 NOTE — Op Note (Signed)
Ebony Reeves, Ebony Reeves NO.:  [id number]  MEDICAL RECORD NO.:  [id number]  LOCATION:  [id number]                        FACILITY:  [facility]  PHYSICIAN:  Estill Bamberg, MD      DATE OF BIRTH:  01-29-1965  DATE OF PROCEDURE:  11/04/2012                              OPERATIVE REPORT   PREOPERATIVE DIAGNOSES: 1. Grade 2 L4-5 spondylolisthesis. 2. Bilateral L4 pars interarticularis defects. 3. Severe bilateral neural foraminal stenosis, L4-5.  POSTOPERATIVE DIAGNOSES: 1. Grade 2 L4-5 spondylolisthesis. 2. Bilateral L4 pars interarticularis defects. 3. Severe bilateral neural foraminal stenosis, L4-5.  PROCEDURES: 1. Anterior lumbar interbody fusion, L4-5. 2. Placement of interbody device x1 (medium, 10-mm, 5-degree lordotic     interbody spacer). 3. Placement of anterior instrumentation, L4-5. 4. Posterior instrumentation, L4-L5 (7 x 45-mm screws x4). 5. Posterior spinal fusion, L4-5. 6. Use of morselized allograft (DBX Mix). 7. Intraoperative use of fluoroscopy.  SURGEON:  Estill Bamberg, MD  ASSISTANT:  Jason Coop, PA-C  ANESTHESIA:  General endotracheal anesthesia.  COMPLICATIONS:  None.  DISPOSITION:  Stable.  ESTIMATED BLOOD LOSS:  Minimal.  INDICATIONS FOR PROCEDURE:  Briefly, Ebony Reeves is a very pleasant 47- year-old female who did present to me with severe bilateral leg pain and weakness.  Radiographs did reveal a substantial spondylolisthesis associated with the L4-5 level.  There was noted to be severe bilateral neural foraminal narrowing as well.  We did go forward with conservative treatment measures, but the patient did continue to have substantial bilateral leg pain.  We therefore did discuss proceeding with the procedure reflected above.  The patient did fully understand the risks and limitations of the procedure as outlined in my preoperative note.  OPERATIVE DETAILS:  On November 04, 2012, the patient was brought to the Surgery and general  endotracheal anesthesia was administered.  The patient was placed supine on the hospital bed.  The abdomen was then prepped and draped in the usual sterile fashion.  An retroperitoneal exposure was then performed by Dr. Gretta Began.  The exposure will be dictated on a separate operative report.  I did assist him in the exposure.  Once the L4-5 intervertebral space was identified, I did use a knife to perform an annulotomy anteriorly.  I then went forward with a thorough and complete diskectomy from the anterior, to the posterior aspect of the intervertebral disk.  I did prepare the endplates.  I did place a series of trials and I was able to securely advanced a 10-mm trial into the intervertebral space.  I did obtain AP and lateral fluoroscopy and was very pleased with the construct.  I then selected a medium, 10-mm interbody implant with 5 degrees of lordosis.  This was packed with DBX Mix and tamped into position.  I was extremely pleased with the restoration of the intervertebral height noted on the lateral fluoroscopic view.  I then used an awl to prepare trajectory of an anterior screw into the L5 vertebral body.  A 20-mm screw with a washer was then placed into the anterior vertebral body at L5.  A washer did secure the intervertebral implant appropriately, and I did stabilize the L4-5 interspace, to ensure that  there was no anterior extrusion of the implant.  I was extremely pleased with the press-fit of the screw.  At this point, I copiously irrigated the wound.  I then closed the fascia using #1 PDS.  The deep subcutaneous layer was then closed using 0 Vicryl followed by 2-0 Vicryl.  The skin was closed using 3-0 Monocryl. Sterile dressing was then applied.  The patient was then turned supine in rotatory fashion on the [place] spinal frame.  All bony prominences were meticulously padded and the back was again prepped and draped in the usual sterile fashion.  I then made two  paramedian incisions on both the right and the left sides, just lateral to the lateral border of the L4 and L5 pedicles.  I did liberally use fluoroscopy to demarcate the L4 and L5 pedicles.  Then, on the left side, I did subperiosteally expose the transverse processes of L4 and L5, in addition to the L4-5 facet joint.  I then used a high-speed bur to decorticate the transverse processes on the left at L4 and L5 in addition to the L4-5 facet joint, and again, DBX Mix was packed in the posterolateral gutter.  I then advanced Jamshidi needles across the L4 and L5 pedicles bilaterally at L4 and at L5.  The Jamshidi needles were then advanced across the pedicle.  I did use AP and lateral fluoroscopy.  I then placed guidewires over the needles and I then advanced 5-mm taps across the L4 and L5 pedicles bilaterally.  I did use triggered EMG to test each of the taps, and there was no tap that tested below 12 milliamps.  I then advanced 7 x 45-mm screws bilaterally.  A 40-mm rod was then secured into the heads of the screws on the right, and a 45-mm rod was secured into the heads of the screws on the left.  Caps were placed and the final locking procedure was performed.  I was extremely pleased with the appearance of the construct at both the AP and lateral fluoroscopic images.  I then copiously irrigated the wounds.  The fascia was then closed using #1 Vicryl.  The subcutaneous layer was then closed using 2- 0 Vicryl, and the skin was then closed using 3-0 Monocryl bilaterally. Benzoin and Steri-Strips were then applied followed by sterile dressing. All instrument counts were correct at the termination of the procedure.  Of note, Jason Coop was my assistant throughout the entirety of the procedure, including the anterior and posterior aspects of the procedure.  She did aid in essential retraction, suctioning and placement of the hardware.     Estill Bamberg, MD     MD/MEDQ  D:   11/04/2012  T:  11/05/2012  Job:  [id number]

## 2012-11-05 NOTE — Evaluation (Signed)
Occupational Therapy Evaluation Patient Details Name: Marcena Dias MRN: [id number] DOB: 1965-12-27 Today's Date: 11/05/2012 Time: 9528-4132 OT Time Calculation (min): 38 min  OT Assessment / Plan / Recommendation History of present illness Zayley Arras is a 47 y.o. female who presents with ongoing bilateral leg pain. 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 Patient failed extensive conservative care. Pt now s/p A/P L4/5 decompression and fusion    Clinical Impression   Patient is s/p L4-5 urgery resulting in functional limitations due to the deficits listed below (see OT problem list).  Patient will benefit from skilled OT acutely to increase independence and safety with ADLS to allow discharge HHOT.     OT Assessment  Patient needs continued OT Services    Follow Up Recommendations  Home health OT;Supervision/Assistance - 24 hour    Barriers to Discharge      Equipment Recommendations  3 in 1 bedside comode;Other (comment) (RW)    Recommendations for Other Services    Frequency  Min 2X/week    Precautions / Restrictions Precautions Precautions: Back Precaution Booklet Issued: Yes (comment) Required Braces or Orthoses: Spinal Brace Spinal Brace: Thoracolumbosacral orthotic   Pertinent Vitals/Pain 5 out 10 pain Pain is at incision site RN Kim made aware and entered the room with therapist present to help advocate patients request    ADL  Eating/Feeding: Modified independent Where Assessed - Eating/Feeding: Chair Grooming: Wash/dry face;Teeth care;Modified independent Where Assessed - Grooming: Supported sitting Upper Body Dressing: +2 Total assistance (don brace) Where Assessed - Upper Body Dressing: Supported sitting Lower Body Dressing: +1 Total assistance Where Assessed - Lower Body Dressing: Supported sit to Pharmacist, hospital: Minimal Dentist Method: Surveyor, minerals: Raised toilet seat with  arms (or 3-in-1 over toilet) Toileting - Clothing Manipulation and Hygiene: +1 Total assistance Where Assessed - Toileting Clothing Manipulation and Hygiene: Sit to stand from 3-in-1 or toilet Equipment Used: Back brace;Rolling walker Transfers/Ambulation Related to ADLs: Pt completed Sit<>Stand from bed and transfer to recliner on Rt side. Pt transfering to the right due to c/o pain in left LE ADL Comments: PT educated on back precautiosn and wear schedule for fabricated TLSO brace. Pt very excited to don brace. pt total +2 (A) to place brace on. Pt without pain medication in PCA per patient for 1 hour. RN Selena Batten called to room to address management. Vital taken x2 during session. Sitting 103/52 and sitting in chair 107/75.     OT Diagnosis: Generalized weakness;Acute pain  OT Problem List: Decreased strength;Decreased activity tolerance;Impaired balance (sitting and/or standing);Decreased safety awareness;Decreased knowledge of use of DME or AE;Decreased knowledge of precautions;Pain OT Treatment Interventions: Self-care/ADL training;Therapeutic exercise;DME and/or AE instruction;Therapeutic activities;Patient/family education;Balance training   OT Goals(Current goals can be found in the care plan section) Acute Rehab OT Goals Patient Stated Goal: to go home OT Goal Formulation: With patient Time For Goal Achievement: 11/19/12 Potential to Achieve Goals: Good ADL Goals Pt Will Perform Grooming: with min guard assist;standing Pt Will Perform Upper Body Dressing: with mod assist;sitting Pt Will Perform Lower Body Dressing: with mod assist;sit to/from stand;with adaptive equipment Pt Will Transfer to Toilet: with min guard assist;bedside commode Additional ADL Goal #1: Pt and caregiver will don doff brace MOD I   Visit Information  Last OT Received On: 11/05/12 Assistance Needed: +1 PT/OT Co-Evaluation/Treatment: Yes History of Present Illness: Marybella Ethier is a 47 y.o. female who presents  with ongoing bilateral leg pain. xrays  and MRI reveal a high grade 4/5 slippage and severe NF stenosis. Patient failed extensive conservative care. Pt now s/p A/P L4/5 decompression and fusion        Prior Functioning     Home Living Family/patient expects to be discharged to:: Private residence Living Arrangements: Alone;Children Available Help at Discharge: Family;Available 24 hours/day Type of Home: House Home Access: Stairs to enter [employer] of Steps: 2 Home Layout: One level Home Equipment: Walker - 2 wheels;Bedside commode;Shower seat - built in Prior Function Level of Independence: Independent Communication Communication: HOH Dominant Hand: Right         Vision/Perception Vision - History Baseline Vision: Wears glasses only for reading   Cognition  Cognition Arousal/Alertness: Awake/alert Behavior During Therapy: WFL for tasks assessed/performed Overall Cognitive Status: Within Functional Limits for tasks assessed    Extremity/Trunk Assessment Upper Extremity Assessment Upper Extremity Assessment: Overall WFL for tasks assessed Lower Extremity Assessment Lower Extremity Assessment: Defer to PT evaluation Cervical / Trunk Assessment Cervical / Trunk Assessment: Normal     Mobility Bed Mobility Bed Mobility: Rolling Right;Right Sidelying to Sit;Sitting - Scoot to [place] of Bed Rolling Right: 3: Mod assist Right Sidelying to Sit: 3: Mod assist Sitting - Scoot to Edge of Bed: 4: Min assist Details for Bed Mobility Assistance: VCs for technique with log roll, assist to reach sidelying, assist for elevation to elbow and then to upright. Assist with lean and trunk support for hip rotation Transfers Transfers: Sit to Stand;Stand to Sit Sit to Stand: 1: +2 Total assist Sit to Stand: Patient Percentage: 80% Stand to Sit: 1: +2 Total assist Stand to Sit: Patient Percentage: 80% Details for Transfer Assistance: Assist for stability of RW and elevation,  VCs for upright posture, Manual assist for hand placement to sit      Exercise     Balance Balance Balance Assessed: Yes Static Sitting Balance Static Sitting - Balance Support: Feet supported Static Sitting - Level of Assistance: 4: Min assist Static Sitting - Comment/# of Minutes: seated EOB   End of Session OT - End of Session Activity Tolerance: Patient tolerated treatment well Patient left: in chair;with call bell/phone within reach Nurse Communication: Mobility status;Precautions  GO     Harolyn Rutherford 11/05/2012, 12:17 PM Pager: [phone]

## 2012-11-05 NOTE — Evaluation (Signed)
Physical Therapy Evaluation Patient Details Name: Ebony Reeves MRN: [id number] DOB: 07-Jun-1965 Today's Date: 11/05/2012 Time: 4098-1191 PT Time Calculation (min): 43 min  PT Assessment / Plan / Recommendation History of Present Illness  Ebony Reeves is a 47 y.o. female who presents with ongoing bilateral leg pain. 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 Patient failed extensive conservative care. Pt now s/p A/P L4/5 decompression and fusion   Clinical Impression  Patient demonstrates deficits in functional mobility as indicated below. Pt will benefit from continued skilled PT to address deficits and maximize independence. Will continue to see as indicated. rec HHPT upon discharge with family supervision and PRN assist.      PT Assessment  Patient needs continued PT services    Follow Up Recommendations  Home health PT;Supervision/Assistance - 24 hour          Equipment Recommendations  None recommended by PT       Frequency Min 5X/week    Precautions / Restrictions Precautions Precautions: Back Precaution Booklet Issued: Yes (comment) Required Braces or Orthoses: Spinal Brace Spinal Brace: Thoracolumbosacral orthotic   Pertinent Vitals/Pain 2/10 at rest and 5/10 with activity      Mobility  Bed Mobility Bed Mobility: Rolling Right;Right Sidelying to Sit;Sitting - Scoot to [place] of Bed Rolling Right: 3: Mod assist Right Sidelying to Sit: 3: Mod assist Sitting - Scoot to Edge of Bed: 4: Min assist Details for Bed Mobility Assistance: VCs for technique with log roll, assist to reach sidelying, assist for elevation to elbow and then to upright. Assist with lean and trunk support for hip rotation Transfers Transfers: Sit to Stand;Stand to Sit Sit to Stand: 1: +2 Total assist Sit to Stand: Patient Percentage: 80% Stand to Sit: 1: +2 Total assist Stand to Sit: Patient Percentage: 80% Details for Transfer Assistance: Assist for stability of RW  and elevation, VCs for upright posture, Manual assist for hand placement to sit  Ambulation/Gait Ambulation/Gait Assistance: 4: Min guard;4: Min Environmental consultant (Feet): 6 Feet Assistive device: Rolling walker Ambulation/Gait Assistance Details: slow lateral steps to chair        PT Diagnosis: Difficulty walking;Generalized weakness;Acute pain  PT Problem List: Decreased strength;Decreased range of motion;Decreased activity tolerance;Decreased balance;Decreased mobility;Pain PT Treatment Interventions: DME instruction;Gait training;Stair training;Functional mobility training;Therapeutic activities;Therapeutic exercise;Balance training;Patient/family education     PT Goals(Current goals can be found in the care plan section) Acute Rehab PT Goals Patient Stated Goal: to go home PT Goal Formulation: With patient Time For Goal Achievement: 11/19/12 Potential to Achieve Goals: Good  Visit Information  Last PT Received On: 11/05/12 Assistance Needed: +1 History of Present Illness: Ebony Reeves is a 47 y.o. female who presents with ongoing bilateral leg pain. 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 Patient failed extensive conservative care. Pt now s/p A/P L4/5 decompression and fusion        Prior Functioning  Home Living Family/patient expects to be discharged to:: Private residence Living Arrangements: Alone;Children Available Help at Discharge: Family;Available 24 hours/day Type of Home: House Home Access: Stairs to enter [employer] of Steps: 2 Home Layout: One level Home Equipment: Walker - 2 wheels;Bedside commode;Shower seat - built in Prior Function Level of Independence: Independent Communication Communication: HOH Dominant Hand: Right    Cognition  Cognition Arousal/Alertness: Awake/alert Behavior During Therapy: WFL for tasks assessed/performed Overall Cognitive Status: Within Functional Limits for tasks assessed     Extremity/Trunk Assessment Upper  Extremity Assessment Upper Extremity Assessment: Overall WFL for tasks assessed Lower Extremity Assessment Lower Extremity Assessment: Defer to PT evaluation Cervical / Trunk Assessment Cervical / Trunk Assessment: Normal   Balance Balance Balance Assessed: Yes Static Sitting Balance Static Sitting - Balance Support: Feet supported Static Sitting - Level of Assistance: 4: Min assist Static Sitting - Comment/# of Minutes: seated EOB  End of Session PT - End of Session Equipment Utilized During Treatment: Gait belt Activity Tolerance: Patient tolerated treatment well;Patient limited by pain Patient left: in chair;with call bell/phone within reach Nurse Communication: Mobility status  GP     Fabio Asa 11/05/2012, 12:15 PM Charlotte Crumb, PT DPT  [phone]

## 2012-11-06 LAB — URINALYSIS, ROUTINE W REFLEX MICROSCOPIC
Glucose, UA: NEGATIVE mg/dL
Leukocytes, UA: NEGATIVE
Protein, ur: NEGATIVE mg/dL
pH: 7.5 (ref 5.0–8.0)

## 2012-11-06 LAB — URINE MICROSCOPIC-ADD ON

## 2012-11-06 NOTE — Progress Notes (Signed)
Physical Therapy Treatment Patient Details Name: Ebony Reeves MRN: [id number] DOB: 1965/06/20 Today's Date: 11/06/2012 Time: 4696-2952 PT Time Calculation (min): 23 min  PT Assessment / Plan / Recommendation  History of Present Illness Ebony Reeves is a 47 y.o. female who presents with ongoing bilateral leg pain. 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 Patient failed extensive conservative care. Pt now s/p A/P L4/5 decompression and fusion    PT Comments   Patient demonstrates significant improvements in mobility today. Negotiated stairs and increased ambulation without assist. Will continue to see as indicated.   Follow Up Recommendations  Home health PT;Supervision/Assistance - 24 hour     Does the patient have the potential to tolerate intense rehabilitation     Barriers to Discharge        Equipment Recommendations  None recommended by PT    Recommendations for Other Services    Frequency Min 5X/week   Progress towards PT Goals Progress towards PT goals: Progressing toward goals  Plan Current plan remains appropriate    Precautions / Restrictions Precautions Precautions: Back Precaution Booklet Issued: Yes (comment) Required Braces or Orthoses: Spinal Brace Spinal Brace: Thoracolumbosacral orthotic   Pertinent Vitals/Pain 2/10     Mobility  Bed Mobility Bed Mobility: Not assessed Details for Bed Mobility Assistance: max a to initiate log roll, able to push  LUE into bed for transitioning from sidelying to sit.  and scooted hips to eob to get feet flat on the floor.   Transfers Transfers: Sit to Stand;Stand to Sit Sit to Stand: 5: Supervision;With armrests;From chair/3-in-1 Stand to Sit: 5: Supervision;To chair/3-in-1 Details for Transfer Assistance: no physical assist needed Ambulation/Gait Ambulation/Gait Assistance: 6: Modified independent (Device/Increase time) Ambulation Distance (Feet): 600 Feet Assistive device: Rolling  walker Ambulation/Gait Assistance Details: good stability and compliance with precautions Gait Pattern: Step-through pattern Gait velocity: decreased General Gait Details: steady Stairs: Yes Stairs Assistance: 5: Supervision Stair Management Technique: One rail Right;Step to pattern;Forwards Number of Stairs: 6    Exercises     PT Diagnosis:    PT Problem List:   PT Treatment Interventions:     PT Goals (current goals can now be found in the care plan section) Acute Rehab PT Goals Patient Stated Goal: to go home PT Goal Formulation: With patient Time For Goal Achievement: 11/19/12 Potential to Achieve Goals: Good  Visit Information  Last PT Received On: 11/06/12 Assistance Needed: +1 History of Present Illness: Ebony Reeves is a 47 y.o. female who presents with ongoing bilateral leg pain. 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 Patient failed extensive conservative care. Pt now s/p A/P L4/5 decompression and fusion     Subjective Data  Patient Stated Goal: to go home   Cognition  Cognition Arousal/Alertness: Awake/alert Behavior During Therapy: WFL for tasks assessed/performed Overall Cognitive Status: Within Functional Limits for tasks assessed    Balance  Balance Balance Assessed: Yes Static Sitting Balance Static Sitting - Balance Support: Feet supported Static Sitting - Level of Assistance: 4: Min assist  End of Session PT - End of Session Equipment Utilized During Treatment: Gait belt Activity Tolerance: Patient tolerated treatment well;Patient limited by pain Patient left: in chair;with call bell/phone within reach Nurse Communication: Mobility status   GP     Fabio Asa 11/06/2012, 9:46 AM Charlotte Crumb, PT DPT  [phone]

## 2012-11-06 NOTE — Progress Notes (Signed)
Occupational Therapy Treatment Patient Details Name: Ebony Reeves MRN: [id number] DOB: November 30, 1965 Today's Date: 11/06/2012 Time: 4098-1191 OT Time Calculation (min): 33 min  OT Assessment / Plan / Recommendation       OT comments  Awake and agreeable to participation in skilled ot.  All aspects of bed mobility max a.  Amb. Approx. 10-13 steps to b.room with no lob or safety issues noted.  Max a for peri care and lb self care tasks but states she will have a lot of family assist at home.  Donned brace with max a.  Feeling great today, says she is very pleased with her increased mobility and decreased pain.  Follow Up Recommendations  Home health OT;Supervision/Assistance - 24 hour           Equipment Recommendations  3 in 1 bedside comode        Frequency Min 2X/week   Progress towards OT Goals Progress towards OT goals: Progressing toward goals  Plan Discharge plan remains appropriate    Precautions / Restrictions Precautions Precautions: Back Required Braces or Orthoses: Spinal Brace Spinal Brace: Thoracolumbosacral orthotic;Applied in sitting position   Pertinent Vitals/Pain 1/10    ADL  Upper Body Dressing: Performed;+1 Total assistance Where Assessed - Upper Body Dressing: Unsupported sitting Toilet Transfer: Performed;Min guard Toilet Transfer Method: Sit to Barista: Raised toilet seat with arms (or 3-in-1 over toilet);Grab bars Toileting - Clothing Manipulation and Hygiene: Performed;Minimal assistance Where Assessed - Glass blower/designer Manipulation and Hygiene: Standing Transfers/Ambulation Related to ADLs: sit/stand from elevated bed with min a, amb. around bed into b.room with min guard a.  several sit/stands all with mina to /min guard a ADL Comments: reviewed back precautions with pt., she is able to verbalize 3/3.  toilet transfer min guard a, hygiene max a in standing for front peri care.  reviewed benefits of wet flushable toilet  paper.  able to don brace with max/total a x1 but was able to provide inst. and state proper donning tech.  reviewed leaving one side velcroed to allow for easier donning.  declined needed a/e for lb selfcare.  will have parents and dtr. to assist.  demonstrated shower stall transfer. pt. has shower stall with built in bench/chari and very small ledge to enter       OT Goals(current goals can now be found in the care plan section)    Visit Information  Last OT Received On: 11/06/12    Subjective Data   "i never thought i would be able to move and feel this good"          Cognition  Cognition Arousal/Alertness: Awake/alert Behavior During Therapy: WFL for tasks assessed/performed Overall Cognitive Status: Within Functional Limits for tasks assessed    Mobility  Bed Mobility Details for Bed Mobility Assistance: max a to initiate log roll, able to push  LUE into bed for transitioning from sidelying to sit.  and scooted hips to eob to get feet flat on the floor.   Transfers Transfers: Sit to Stand;Stand to Sit Sit to Stand: 4: Min assist;With upper extremity assist;From elevated surface;From bed Stand to Sit: 4: Min guard;4: Min assist;With upper extremity assist;With armrests;To chair/3-in-1 Details for Transfer Assistance: cues for tech.               End of Session OT - End of Session Activity Tolerance: Patient tolerated treatment well Patient left: in chair;with call bell/phone within reach       Robet Leu, COTA/L  11/06/2012, 9:17 AM

## 2012-11-06 NOTE — Progress Notes (Signed)
Pt complain of pain upon urination, frequency and urgency. Small output each time to urinate between 75 and . Pt states she thought it was UTI, paged oncall MD and obtained order for in and out cath and UA. In and out cath  attempted twice, pt urinated during first in and out attempt, and second time urine return of 400. Sample sent to lab. Will continue to monitor. [place], Rainn Bullinger N, RN

## 2012-11-06 NOTE — Progress Notes (Signed)
Patient doing well, minimal pain in low back. No leg pain. Making progress in PT/OT, without complaint. Fees she is improving every day.  Patient is very happy and appreciative of her care.   BP 113/59  Pulse 80  Temp(Src) 98.3 F (36.8 C) (Oral)  Resp 18  Ht 5\' 7"  (1.702 m)  SpO2 95%  NVI, Dressing CDI, SCD's in place  POD #2 s/p A/P L4/5 decompression and fusion, doing well very  - Cont PT and OT today   - brace when up and ambulating  - Back precautions at all times - Cont percocet and valium - D/C home Saturday vs Sunday pending px control and PT/OT  -Written scripts and D/C instructions printed and in chart

## 2012-11-07 MED ORDER — CALCIUM CARBONATE ANTACID 500 MG PO CHEW
1.0000 | CHEWABLE_TABLET | Freq: Three times a day (TID) | ORAL | Status: DC | PRN
  Administered 2012-11-07: 200 mg via ORAL
  Filled 2012-11-07: qty 1

## 2012-11-07 MED ORDER — RANITIDINE HCL 150 MG/10ML PO SYRP
150.0000 mg | ORAL_SOLUTION | Freq: Two times a day (BID) | ORAL | Status: DC
  Administered 2012-11-07: 150 mg via ORAL
  Filled 2012-11-07 (×2): qty 10

## 2012-11-07 NOTE — Progress Notes (Signed)
Patient doing well, minimal pain in low back. No leg pain. Making progress in PT/OT, without complaint. Feels she is improving every day.  Desires to take ranitidine as not having as much response to our formulary pepcid TUMS may also help  BP 146/76  Pulse 92  Temp(Src) 97.9 F (36.6 C) (Oral)  Resp 20  Ht 5\' 7"  (1.702 m)  SpO2 95%  NVI, Dressing CDI, SCD's in place  POD #3 s/p A/P L4/5 decompression and fusion, doing well very  - Cont PT and OT today   - brace when up and ambulating  - Back precautions at all times - Cont percocet and valium - D/C home today vs Sunday pending px control and PT/OT  -Written scripts and D/C instructions printed and in chart

## 2012-11-07 NOTE — Progress Notes (Signed)
Physical Therapy Treatment Patient Details Name: Ebony Reeves MRN: [id number] DOB: 20-Apr-1965 Today's Date: 11/07/2012 Time: 4098-1191 PT Time Calculation (min): 26 min  PT Assessment / Plan / Recommendation  History of Present Illness Ebony Reeves is a 47 y.o. female who presents with ongoing bilateral leg pain. 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 Patient failed extensive conservative care. Pt now s/p A/P L4/5 decompression and fusion    PT Comments   Patient demonstrates independent ambulation without device, good compliance with precautions. Educated patient and family on techniques for car transfer as well as mobility expectations upon discharge.    Follow Up Recommendations  Home health PT;Supervision/Assistance - 24 hour     Does the patient have the potential to tolerate intense rehabilitation     Barriers to Discharge        Equipment Recommendations  None recommended by PT    Recommendations for Other Services    Frequency Min 5X/week   Progress towards PT Goals Progress towards PT goals: Progressing toward goals  Plan Current plan remains appropriate    Precautions / Restrictions Precautions Precautions: Back Precaution Booklet Issued: Yes (comment) Required Braces or Orthoses: Spinal Brace Spinal Brace: Thoracolumbosacral orthotic   Pertinent Vitals/Pain 1/10    Mobility  Bed Mobility Bed Mobility: Rolling Left;Left Sidelying to Sit;Sitting - Scoot to Edge of Bed Rolling Left: 5: Supervision Left Sidelying to Sit: 5: Supervision Sitting - Scoot to Edge of Bed: 5: Supervision Transfers Transfers: Sit to Stand;Stand to Sit Sit to Stand: 6: Modified independent (Device/Increase time) Stand to Sit: 6: Modified independent (Device/Increase time) Details for Transfer Assistance: no physical assist needed Ambulation/Gait Ambulation/Gait Assistance: 7: Independent Ambulation Distance (Feet): 500 Feet Assistive device:  None Ambulation/Gait Assistance Details: good stability Gait Pattern: Step-through pattern Gait velocity: decreased General Gait Details: steady Stairs: Yes Stairs Assistance: 6: Modified independent (Device/Increase time) Stair Management Technique: One rail Right;Step to pattern;Forwards Number of Stairs: 5      PT Goals (current goals can now be found in the care plan section) Acute Rehab PT Goals Patient Stated Goal: to go home PT Goal Formulation: With patient Time For Goal Achievement: 11/19/12 Potential to Achieve Goals: Good  Visit Information  Last PT Received On: 11/07/12 Assistance Needed: +1 History of Present Illness: Ebony Reeves is a 47 y.o. female who presents with ongoing bilateral leg pain. 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 Patient failed extensive conservative care. Pt now s/p A/P L4/5 decompression and fusion     Subjective Data  Subjective: I feel so much better, i never thought I would Patient Stated Goal: to go home   Cognition  Cognition Arousal/Alertness: Awake/alert Behavior During Therapy: WFL for tasks assessed/performed Overall Cognitive Status: Within Functional Limits for tasks assessed    Balance  Balance Balance Assessed: Yes Static Sitting Balance Static Sitting - Balance Support: Feet supported Static Sitting - Level of Assistance: 6: Modified independent (Device/Increase time)  End of Session PT - End of Session Equipment Utilized During Treatment: Gait belt Activity Tolerance: Patient tolerated treatment well;Patient limited by pain Patient left: in chair;with call bell/phone within reach Nurse Communication: Mobility status   GP     Fabio Asa 11/07/2012, 3:20 PM Charlotte Crumb, PT DPT  [phone]

## 2012-11-12 NOTE — Plan of Care (Signed)
Problem: Discharge Progression Outcomes Goal: Discharge plan in place and appropriate Outcome: Completed/Met Date Met:  11/12/12 Patient to be discharged to home with family support and guidance. Goal: Pain controlled with appropriate interventions Outcome: Completed/Met Date Met:  11/12/12 Taking medication as ordered for pain; reports relief from current regime. Goal: Ambulates without assistance Outcome: Completed/Met Date Met:  11/12/12 Patient able to ambulate with standby assist only. Goal: Demonstrates proper use of assistive devices Outcome: Completed/Met Date Met:  11/12/12 Patient uses brace when up and ambulating.

## 2012-11-12 NOTE — Plan of Care (Signed)
Problem: Phase III Progression Outcomes Goal: Pain controlled on oral analgesia Outcome: Completed/Met Date Met:  11/12/12 Patient taking and tolerating PO pain medications.

## 2012-11-12 NOTE — Plan of Care (Signed)
Problem: Phase III Progression Outcomes Goal: Demonstrates proper use of assistive devices Outcome: Completed/Met Date Met:  11/12/12 Patient uses wheelchair for increased stability when ambulating. Goal: Discharge plan remains appropriate-arrangements made Outcome: Completed/Met Date Met:  11/12/12 Patient plan to continue to be discharged to home.

## 2012-11-18 NOTE — Discharge Summary (Signed)
Patient ID: Ebony Reeves MRN: [id number] DOB/AGE: [birth year] 47 y.o.  Admit date: 11/04/2012 Discharge date: 11/07/2012  Admission Diagnoses: Radiculopathy   Discharge Diagnoses:  Same  Past Medical History  Diagnosis Date  . GERD (gastroesophageal reflux disease)   . Arthritis   . Hypothyroidism   . 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 Arbie Cookey Vascular Discharged Condition: Improved  Hospital Course: Ebony Reeves is an 47 y.o. female who was admitted 11/04/2012 for operative treatment of radiculopathy. Patient has severe unremitting pain that affects sleep, daily activities, and work/hobbies. After pre-op clearance the patient was taken to the operating room on 11/04/2012 and underwent  Procedure(s): Lumbar 4-5 anterior lumbar interbody fusion with instrumentation and allograft; Posterior spinal fusion with instrumentation ABDOMINAL EXPOSURE.    Patient was given perioperative antibiotics:  Anti-infectives   Start     Dose/Rate Route Frequency Ordered Stop   11/04/12 2200  ceFAZolin (ANCEF) IVPB 1 g/50 mL premix     1 g 100 mL/hr over 30 Minutes Intravenous Every 8 hours 11/04/12 1823 11/05/12 0604   11/04/12 1415  ceFAZolin (ANCEF) 2 g in dextrose 5 % 100 mL IVPB  Status:  Discontinued     2 g 200 mL/hr over 30 Minutes Intravenous  Once 11/04/12 1407 11/04/12 1809   11/04/12 0600  ceFAZolin (ANCEF) IVPB 2 g/50 mL premix     2 g 100 mL/hr over 30 Minutes Intravenous On call to O.R. 11/03/12 1438 11/04/12 1415       Patient was given sequential compression devices, early ambulation to prevent DVT.  Patient benefited maximally from hospital stay and there were no complications.    Recent vital signs: BP 122/72  Pulse 87  Temp(Src) 97.7 F (36.5  C) (Oral)  Resp 18  Ht 5\' 7"  (1.702 m)  SpO2 96%   Discharge Medications:     Medication List    TAKE these medications       amLODipine 10 MG tablet  Commonly known as:  NORVASC  Take 10 mg by mouth daily.     hydroxypropyl methylcellulose 2.5 % ophthalmic solution  Commonly known as:  ISOPTO TEARS  Place 2 drops into both eyes at bedtime as needed (for dry eyes).     levothyroxine 100 MCG tablet  Commonly known as:  SYNTHROID, LEVOTHROID  Take 100 mcg by mouth daily before breakfast.     ranitidine 150 MG tablet  Commonly known as:  ZANTAC  Take 150 mg by mouth 2 (two) times daily.      ASK your doctor about these medications       meloxicam 15 MG tablet  Commonly known as:  MOBIC  Take 15 mg by mouth daily.     traMADol 50 MG tablet  Commonly known as:  ULTRAM  Take 50 mg by mouth 2 (two) times daily.        Diagnostic Studies: Dg Chest 2 View  10/27/2012   CLINICAL DATA:  Preoperative lumbar fusion; hypertension  EXAM: CHEST  2 VIEW  COMPARISON:  None.  FINDINGS: Lungs are clear. Heart size and pulmonary vascularity are normal. No adenopathy. No bone lesions. There are surgical clips in the right upper quadrant of the abdomen.  IMPRESSION: No edema or consolidation.   Electronically Signed   By: Bretta Bang  M.D.   On: 10/27/2012 14:48   Dg Lumbar Spine 2-3 Views  11/04/2012   CLINICAL DATA:  Lumbar fusion  EXAM: LUMBAR SPINE - 2-3 VIEW; DG C-ARM GT 120 MIN  COMPARISON:  MR 05/30/2012  FINDINGS: Interval placement of bilateral pedicle screws L4 and L5 with vertical interconnecting hardware, intact without evidence of surrounding lucency. Graft markers project placed into the L4-5 interspace with anterior fixation hardware into the L5 vertebral body. Grade 1 anterolisthesis L4-5 as before.  IMPRESSION: Interval instrumented fixation L4-5.   Electronically Signed   By: Oley Balm M.D.   On: 11/04/2012 15:54   Dg C-arm Gt 120 Min  11/04/2012   CLINICAL  DATA:  Lumbar fusion  EXAM: LUMBAR SPINE - 2-3 VIEW; DG C-ARM GT 120 MIN  COMPARISON:  MR 05/30/2012  FINDINGS: Interval placement of bilateral pedicle screws L4 and L5 with vertical interconnecting hardware, intact without evidence of surrounding lucency. Graft markers project placed into the L4-5 interspace with anterior fixation hardware into the L5 vertebral body. Grade 1 anterolisthesis L4-5 as before.  IMPRESSION: Interval instrumented fixation L4-5.   Electronically Signed   By: Oley Balm M.D.   On: 11/04/2012 15:54   Dg Or Local Abdomen  11/04/2012   CLINICAL DATA:  Status post L4-5 anterior fusion. Instrument count.  EXAM: OR LOCAL ABDOMEN  COMPARISON:  None.  FINDINGS: No unexpected radiopaque foreign body is identified. Hardware for L4-5 fusion noted.  IMPRESSION: As above.   Electronically Signed   By: Drusilla Kanner M.D.   On: 11/04/2012 11:47    Disposition: 01-Home or Self Care        Follow-up Information   Follow up with Emilee Hero, MD.   Specialty:  Orthopedic Surgery   Contact information:   [address] [place] [address] [phone]      Patient doing well, minimal pain in low back. No leg pain. Making progress in PT/OT, without complaint. Feels she is improving every day.  Desires to take ranitidine as not having as much response to our formulary pepcid   TUMS may also help   BP 146/76  Pulse 92  Temp(Src) 97.9 F (36.6 C) (Oral)  Resp 20  Ht 5\' 7"  (1.702 m)  SpO2 95%  NVI, Dressing CDI, SCD's in place   POD #3 s/p A/P L4/5 decompression and fusion, doing well very  - Cont PT and OT today  - brace when up and ambulating  - Back precautions at all times  - Cont percocet and valium  - D/C home today  -Written scripts and D/C instructions printed and in chart  Signed: Georga Bora 11/18/2012, 1:52 PM

## 2013-02-23 ENCOUNTER — Encounter: Payer: Self-pay | Admitting: Orthopedic Surgery

## 2013-03-14 ENCOUNTER — Encounter: Payer: Self-pay | Admitting: Orthopedic Surgery

## 2014-09-25 ENCOUNTER — Emergency Department
Admission: EM | Admit: 2014-09-25 | Discharge: 2014-09-25 | Disposition: A | Discharge status: Home / Self Care | Payer: Medicaid Other | Attending: Emergency Medicine | Admitting: Emergency Medicine

## 2014-09-25 DIAGNOSIS — Y9289 Other specified places as the place of occurrence of the external cause: Secondary | ICD-10-CM | POA: Insufficient documentation

## 2014-09-25 DIAGNOSIS — Y9389 Activity, other specified: Secondary | ICD-10-CM | POA: Insufficient documentation

## 2014-09-25 DIAGNOSIS — S46911A Strain of unspecified muscle, fascia and tendon at shoulder and upper arm level, right arm, initial encounter: Secondary | ICD-10-CM | POA: Insufficient documentation

## 2014-09-25 DIAGNOSIS — Z87891 Personal history of nicotine dependence: Secondary | ICD-10-CM | POA: Insufficient documentation

## 2014-09-25 DIAGNOSIS — I10 Essential (primary) hypertension: Secondary | ICD-10-CM | POA: Insufficient documentation

## 2014-09-25 DIAGNOSIS — X58XXXA Exposure to other specified factors, initial encounter: Secondary | ICD-10-CM | POA: Insufficient documentation

## 2014-09-25 DIAGNOSIS — Z79899 Other long term (current) drug therapy: Secondary | ICD-10-CM | POA: Insufficient documentation

## 2014-09-25 DIAGNOSIS — Y998 Other external cause status: Secondary | ICD-10-CM | POA: Insufficient documentation

## 2014-09-25 MED ORDER — IBUPROFEN 800 MG PO TABS: 800.0000 mg | ORAL_TABLET | Freq: Three times a day (TID) | ORAL | Status: DC | PRN

## 2014-09-25 MED ORDER — ORPHENADRINE CITRATE 30 MG/ML IJ SOLN
60.0000 mg | Freq: Two times a day (BID) | INTRAMUSCULAR | Status: DC
  Administered 2014-09-25: 60 mg via INTRAMUSCULAR
  Filled 2014-09-25: qty 2

## 2014-09-25 MED ORDER — ONDANSETRON 4 MG PO TBDP
ORAL_TABLET | ORAL | Status: AC
  Administered 2014-09-25: 4 mg
  Filled 2014-09-25: qty 1

## 2014-09-25 MED ORDER — HYDROMORPHONE HCL 1 MG/ML IJ SOLN
1.0000 mg | Freq: Once | INTRAMUSCULAR | Status: AC
  Administered 2014-09-25: 1 mg via INTRAMUSCULAR
  Filled 2014-09-25: qty 1

## 2014-09-25 MED ORDER — ONDANSETRON HCL 4 MG PO TABS: 4.0000 mg | ORAL_TABLET | Freq: Once | ORAL | Status: AC

## 2014-09-25 MED ORDER — CYCLOBENZAPRINE HCL 10 MG PO TABS: 10.0000 mg | ORAL_TABLET | Freq: Three times a day (TID) | ORAL | Status: DC | PRN

## 2014-09-25 MED ORDER — OXYCODONE-ACETAMINOPHEN 7.5-325 MG PO TABS: 1.0000 | ORAL_TABLET | Freq: Four times a day (QID) | ORAL | Status: DC | PRN

## 2014-09-25 NOTE — ED Notes (Addendum)
Patient reports feeling nauseous, patient diaphoretic and pale, skin cool and clammy. Patient placed on 2L nasal cannula. Verbal order for Zofran given. Ron, PA-C notified.

## 2014-09-25 NOTE — ED Provider Notes (Signed)
[facility] Emergency Department Provider Note  ____________________________________________  Time seen: Approximately 7:56 PM  I have reviewed the triage vital signs and the nursing notes.   HISTORY  Chief Complaint Back Pain    HPI KIMM SIDER is a 49 y.o. female patient complaining of pain secondary to lifting incident 2 days ago. Patient was taking a Engineer, manufacturing systems. Patient stated pain is located mid back just below the shoulder blade. Patient denies any loss sensation but decreased range of motion of overhead reaching and abduction. Patient states she took 2 tramadol also had left over from a previous prescription just taking the edge off the pain.   Past Medical History  Diagnosis Date  . GERD (gastroesophageal reflux disease)   . Arthritis   . Hypothyroidism   . 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 Pre-operative cardiovascular examination 11/03/2012    Past Surgical History  Procedure Laterality Date  . Tonsillectomy    . Cholecystectomy  1995    Gall Bladder  . Anterior and posterior spinal fusion N/A 11/04/2012    Procedure: Lumbar 4-5 anterior lumbar interbody fusion with instrumentation and allograft; Posterior spinal fusion with instrumentation;  Surgeon: Emilee Hero, MD;  Location: [facility] OR;  Service: Orthopedics;  Laterality: N/A;  Lumbar 4-5 anterior lumbar interbody fusion with instrumentation and allograft; Posterior spinal fusion with instrumentation  . Abdominal exposure N/A 11/04/2012    Procedure: ABDOMINAL EXPOSURE;  Surgeon: Larina Earthly, MD;  Location: [facility] OR;  Service: Vascular;  Laterality: N/A;    Current Outpatient Rx  Name  Route  Sig  Dispense  Refill  . amLODipine (NORVASC) 10 MG tablet   Oral   Take 10 mg by mouth daily.         . hydroxypropyl methylcellulose (ISOPTO TEARS) 2.5 % ophthalmic solution   Both Eyes    Place 2 drops into both eyes at bedtime as needed (for dry eyes).         Marland Kitchen levothyroxine (SYNTHROID, LEVOTHROID) 100 MCG tablet   Oral   Take 100 mcg by mouth daily before breakfast.         . ranitidine (ZANTAC) 150 MG tablet   Oral   Take 150 mg by mouth 2 (two) times daily.           Allergies Review of patient's allergies indicates no known allergies.  Family History  Problem Relation Age of Onset  . Cancer Mother     Bladder  . Diabetes Mother   . Hypertension Mother   . Hyperlipidemia Father   . Hypertension Father   . Diabetes Brother     Social History Social History  Substance Use Topics  . Smoking status: Former Smoker -- 0.50 packs/day for 25 years    Types: Cigarettes    Quit date: 11/03/2012  . Smokeless tobacco: Never Used  . Alcohol Use: No    Review of Systems Constitutional: No fever/chills Eyes: No visual changes. ENT: No sore throat. Cardiovascular: Denies chest pain. Respiratory: Denies shortness of breath. Gastrointestinal: No abdominal pain.  No nausea, no vomiting.  No diarrhea.  No constipation. Genitourinary: Negative for dysuria. Musculoskeletal: Right scapular muscle pain. Skin: Negative for rash. Neurological: Negative for headaches, focal weakness or numbness. Endocrine: Hypothyroidism and hypertension. 10-point ROS otherwise negative.  ____________________________________________   PHYSICAL EXAM:  VITAL SIGNS: ED Triage Vitals  Enc Vitals Group     BP 09/25/14 1927 131/80 mmHg     Pulse Rate 09/25/14 1927 90     Resp 09/25/14 1927 20     Temp 09/25/14 1927 98.5 F (36.9 C)     Temp Source 09/25/14 1927 Oral     SpO2 09/25/14 1927 96 %     Weight 09/25/14 1927 294 lb (133.358 kg)     Height 09/25/14 1927 5\' 7"  (1.702 m)     Head Cir --      Peak Flow --      Pain Score 09/25/14 1930 10     Pain Loc --      Pain Edu? --      Excl. in GC? --     Constitutional: Alert and oriented. Moderate distress.  Obesity.  Eyes: Conjunctivae are normal. PERRL. EOMI. Head: Atraumatic. Nose: No congestion/rhinnorhea. Mouth/Throat: Mucous membranes are moist.  Oropharynx non-erythematous. Neck: No stridor.   Cardiovascular: Normal rate, regular rhythm. Grossly normal heart sounds.  Good peripheral circulation. Respiratory: Normal respiratory effort.  No retractions. Lungs CTAB. Gastrointestinal: Soft and nontender. No distention. No abdominal bruits. No CVA tenderness. Musculoskeletal: No spinal deformity. Moderate guarding palpation inferior aspect of the right scapular area. Decreased range of motion of overhead reaching. Decreased strength against resistance on the right side approximately 3 over 5.  Neurologic:  Normal speech and language. No gross focal neurologic deficits are appreciated. No gait instability. Skin:  Skin is warm, dry and intact. No rash noted. Psychiatric: Mood and affect are normal. Speech and behavior are normal.  ____________________________________________   LABS (all labs ordered are listed, but only abnormal results are displayed)  Labs Reviewed - No data to display ____________________________________________  EKG   ____________________________________________  RADIOLOGY   ____________________________________________   PROCEDURES  Procedure(s) performed: None  Critical Care performed: No  ____________________________________________   INITIAL IMPRESSION / ASSESSMENT AND PLAN / ED COURSE  Pertinent labs & imaging results that were available during my care of the patient were reviewed by me and considered in my medical decision making (see chart for details).  } Scapular muscle strain. Patient given injection of Dilaudid and Norflex. Patient given home care instructions for this complaint. Patient discharged prescription for Flexeril and Percocet. She advised to follow with the family doctor if no improvement in 2-3 days.   ____________________________________________   FINAL CLINICAL IMPRESSION(S) / ED DIAGNOSES  Final diagnoses:  Muscle strain of scapular region, right, initial encounter      Joni Reining, PA-C 09/25/14 2008  Myrna Blazer, MD 09/25/14 2251

## 2014-09-25 NOTE — ED Notes (Signed)
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 Patient reports has been having pain since Friday, reports pain is in middle of back, around the bra strap area. Patient had spinal fusion 2 years ago in L4 and L5. Patient grimaces when attempting to sit up. Patient alert and oriented x 4, respirations even and unlabored.

## 2014-09-25 NOTE — ED Notes (Signed)
Patient reports mid back pain (at bra strap) after lifting object and carrying it up some steps.

## 2014-09-25 NOTE — ED Notes (Signed)
Patient reports feeling better, states easier to breath. Vital signs WNL. Ron, PA notified.

## 2014-09-25 NOTE — ED Notes (Signed)

## 2015-02-18 IMAGING — RF DG LUMBAR SPINE 2-3V
1 series · 2 of 2 positions shown · non-contrast
Comparison: MR 05/30/2012

CLINICAL DATA: Lumbar fusion

EXAM:
LUMBAR SPINE - 2-3 VIEW; DG C-ARM GT 120 MIN

[Series 1: run · 2 of 2 slices shown]
[im 1/2]
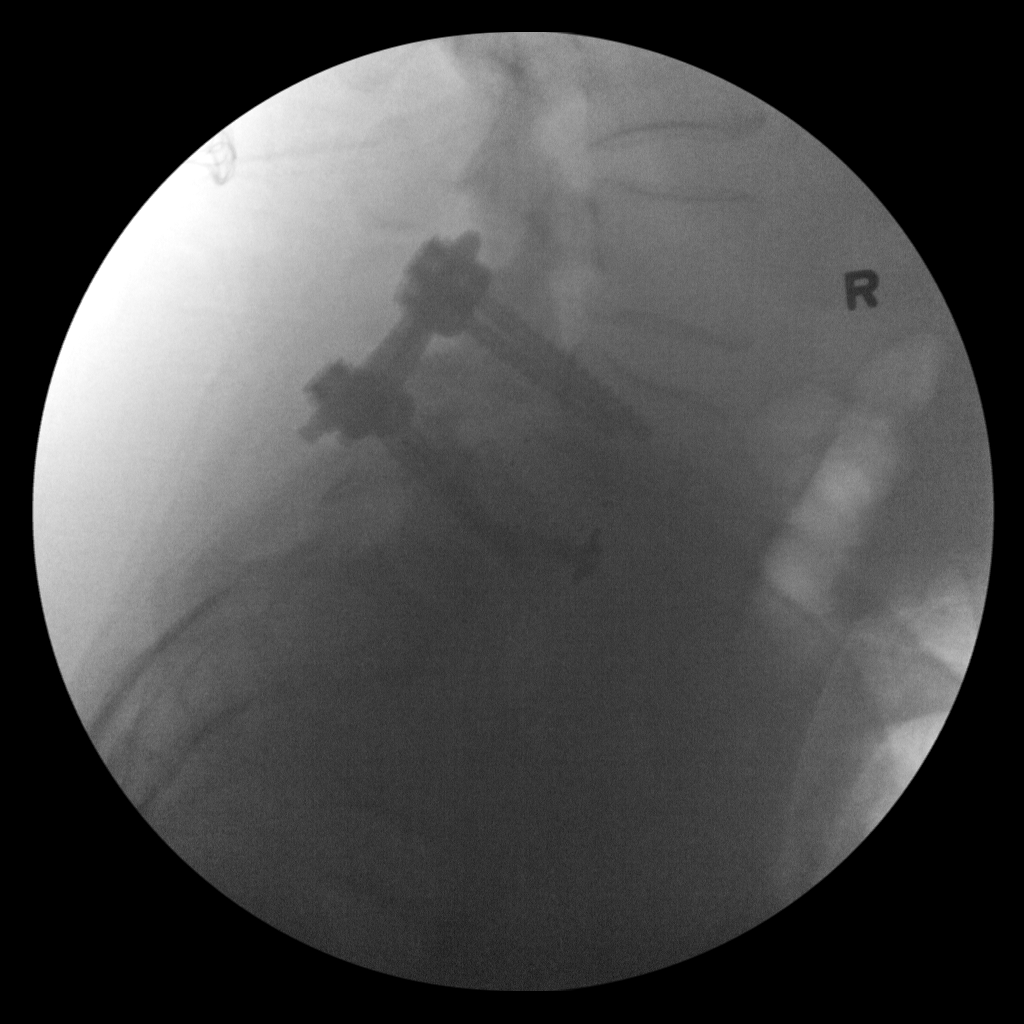
[im 2/2]
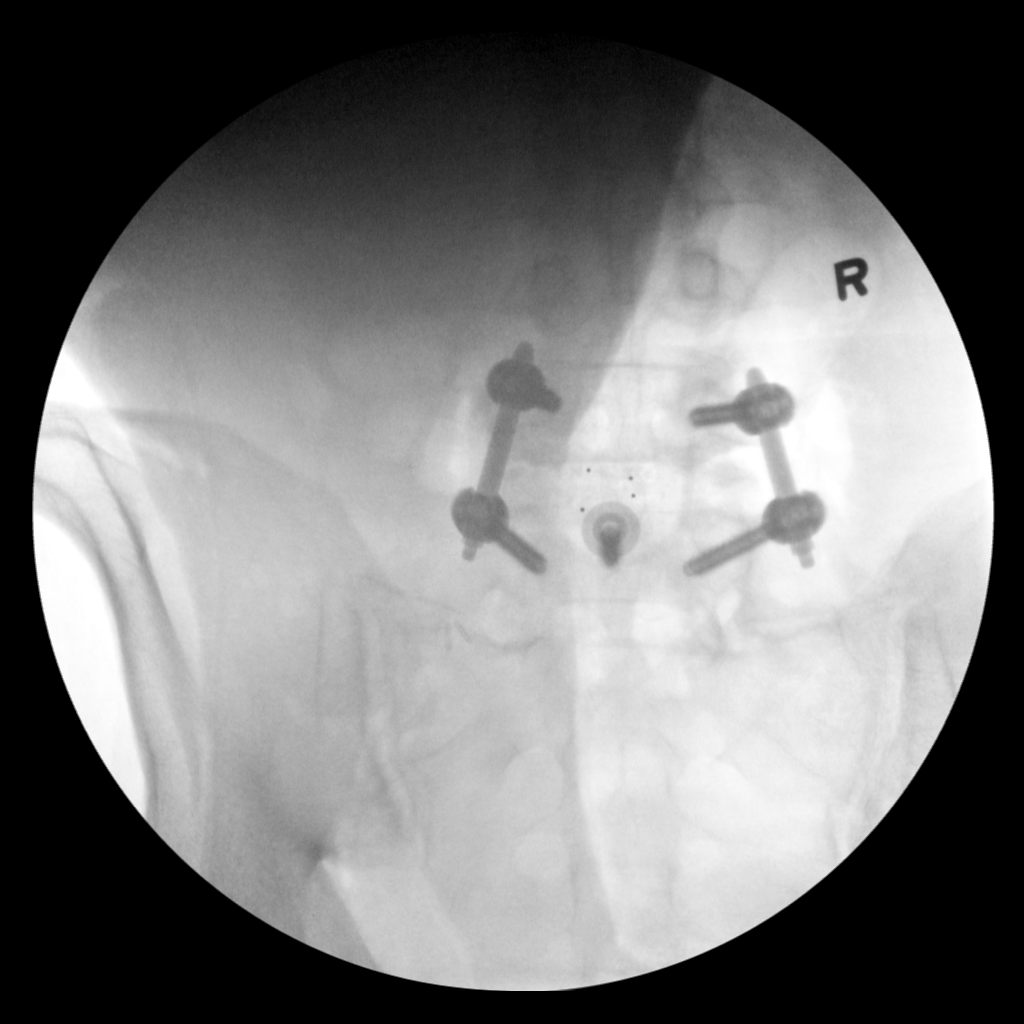

[2 of 2 positions shown; findings below may reference images not displayed]

FINDINGS: Interval placement of bilateral pedicle screws L4 and L5 with
vertical interconnecting hardware, intact without evidence of
surrounding lucency. Graft markers project placed into the L4-5
interspace with anterior fixation hardware into the L5 vertebral
body. Grade 1 anterolisthesis L4-5 as before.
IMPRESSION: Interval instrumented fixation L4-5.

## 2015-07-06 DIAGNOSIS — Z981 Arthrodesis status: Secondary | ICD-10-CM | POA: Insufficient documentation

## 2015-07-06 DIAGNOSIS — E559 Vitamin D deficiency, unspecified: Secondary | ICD-10-CM | POA: Insufficient documentation

## 2015-07-21 ENCOUNTER — Ambulatory Visit
Admission: RE | Admit: 2015-07-21 | Discharge: 2015-07-21 | Disposition: A | Discharge status: Home / Self Care | Payer: Medicaid Other | Source: Ambulatory Visit | Attending: Nurse Practitioner | Admitting: Nurse Practitioner

## 2015-07-21 ENCOUNTER — Other Ambulatory Visit: Payer: Self-pay | Admitting: Nurse Practitioner

## 2015-07-21 DIAGNOSIS — Z6841 Body Mass Index (BMI) 40.0 and over, adult: Principal | ICD-10-CM

## 2015-07-21 DIAGNOSIS — Z01818 Encounter for other preprocedural examination: Secondary | ICD-10-CM | POA: Diagnosis not present

## 2015-07-21 DIAGNOSIS — F17200 Nicotine dependence, unspecified, uncomplicated: Secondary | ICD-10-CM | POA: Insufficient documentation

## 2015-10-23 DIAGNOSIS — E039 Hypothyroidism, unspecified: Secondary | ICD-10-CM | POA: Insufficient documentation

## 2015-10-23 DIAGNOSIS — I10 Essential (primary) hypertension: Secondary | ICD-10-CM | POA: Insufficient documentation

## 2015-10-23 DIAGNOSIS — K219 Gastro-esophageal reflux disease without esophagitis: Secondary | ICD-10-CM | POA: Insufficient documentation

## 2015-12-20 DIAGNOSIS — Z903 Acquired absence of stomach [part of]: Secondary | ICD-10-CM | POA: Insufficient documentation

## 2015-12-20 DIAGNOSIS — K912 Postsurgical malabsorption, not elsewhere classified: Secondary | ICD-10-CM | POA: Insufficient documentation

## 2017-06-26 DIAGNOSIS — E669 Obesity, unspecified: Secondary | ICD-10-CM | POA: Insufficient documentation

## 2017-11-03 IMAGING — CR DG CHEST 2V
1 series · 2 of 2 positions shown · non-contrast
Comparison: Chest x-ray of October 27, 2012

CLINICAL DATA: Preoperative eggs valuation prior to gastric bypass,
former smoker, history of gastroesophageal reflux

EXAM:
CHEST  2 VIEW

[Series 1: dg chest 2 view · 0.14mm/px · 2 of 2 slices shown]
[im 1/2]
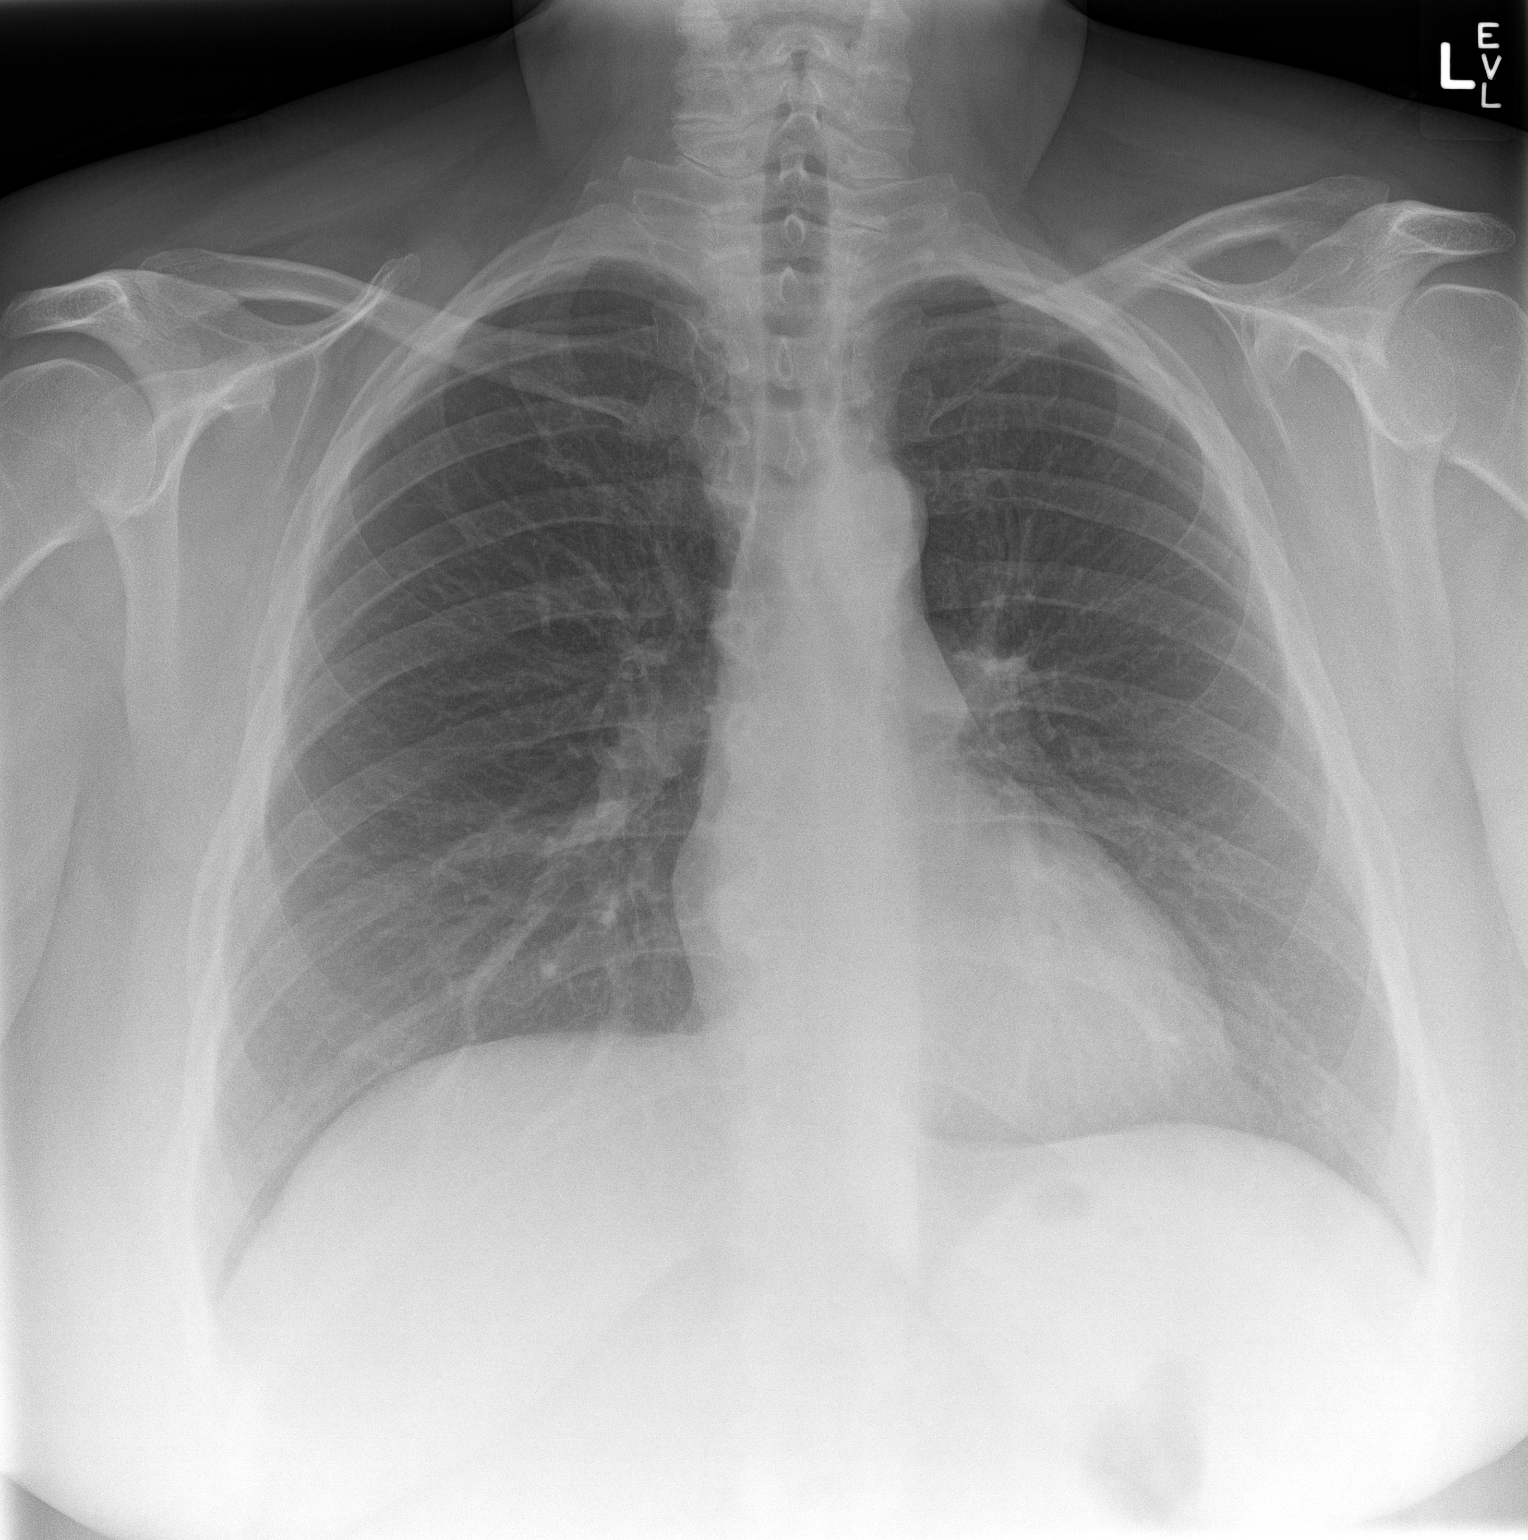
[im 2/2]
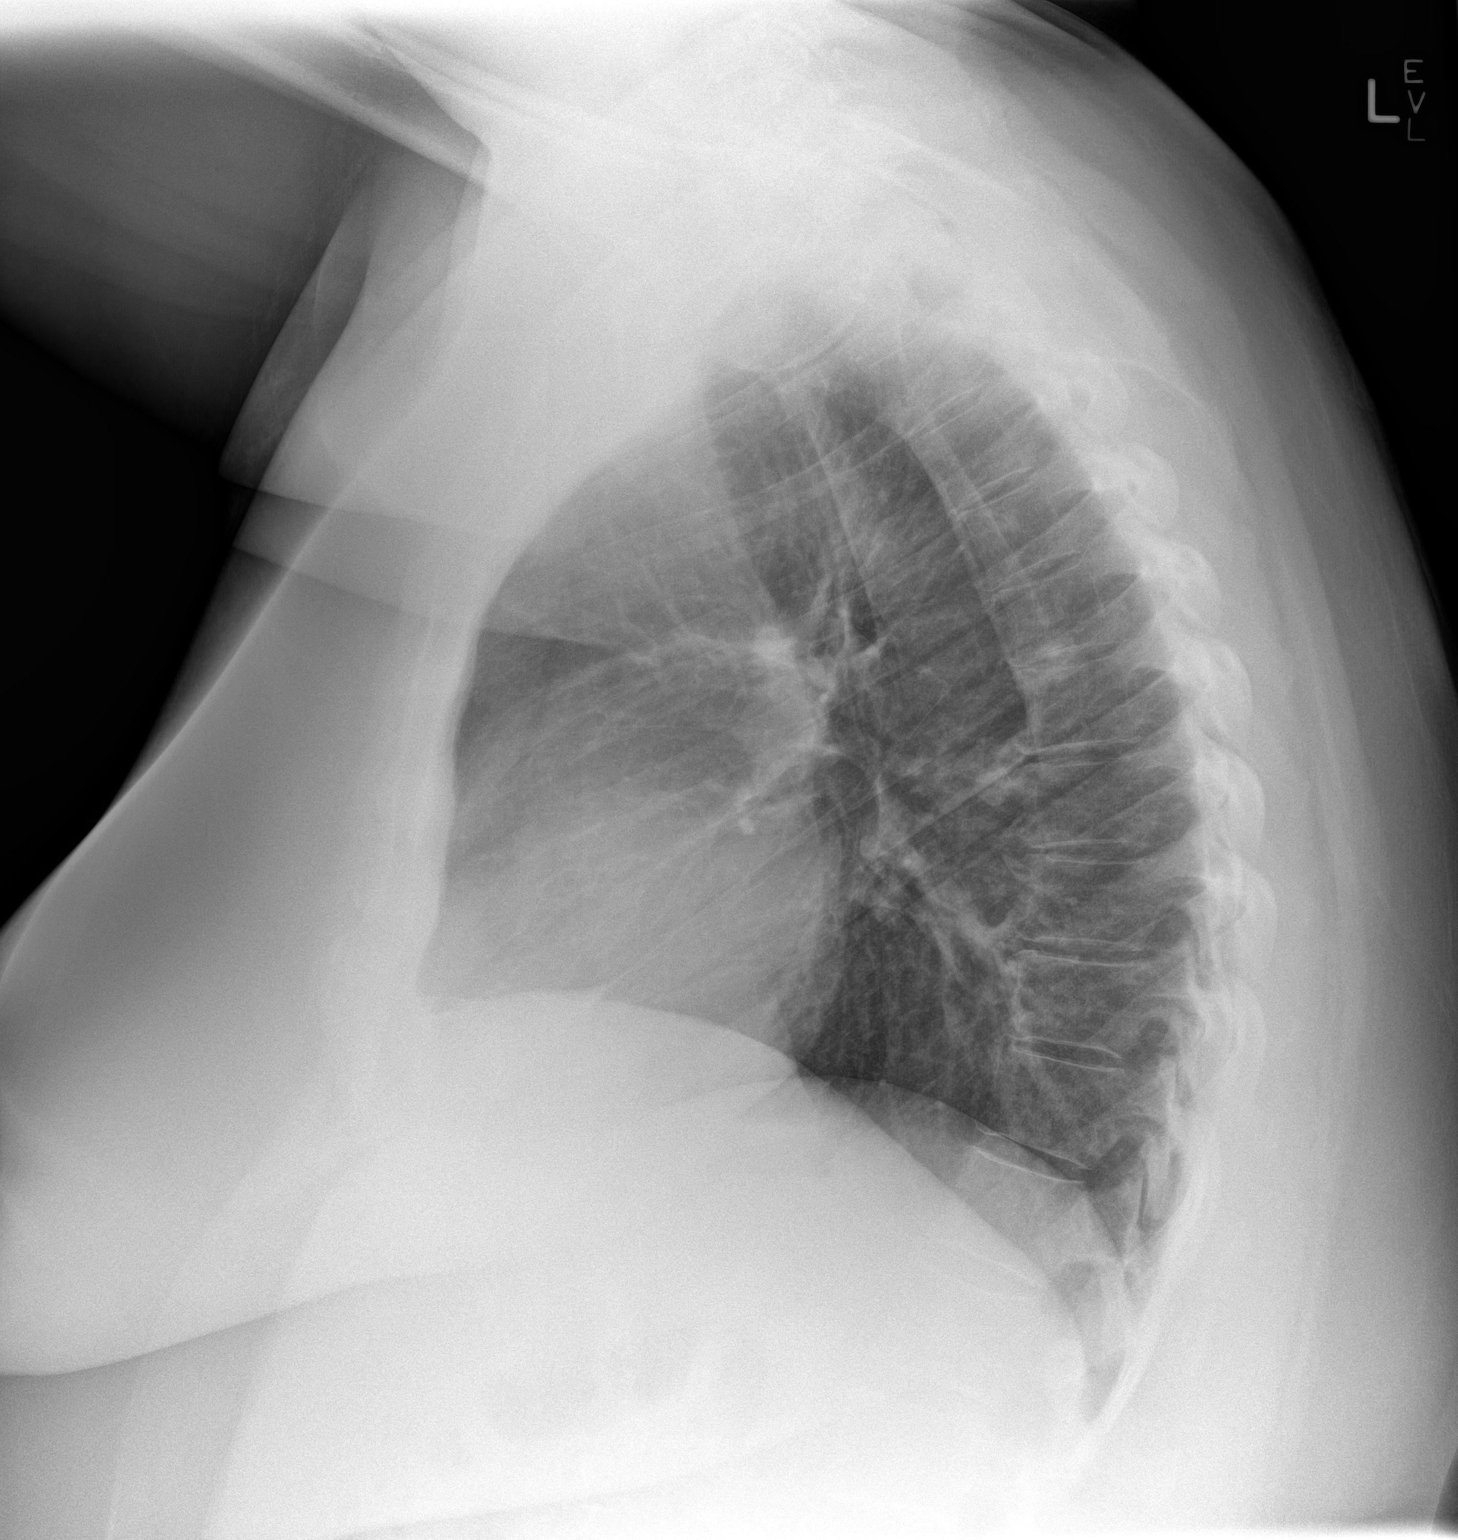

[2 of 2 positions shown; findings below may reference images not displayed]

FINDINGS: The lungs are well-expanded and clear. The heart and pulmonary
vascularity are normal. The mediastinum is normal in width. There is
no pleural effusion. The bony thorax is unremarkable.
IMPRESSION: There is no active cardiopulmonary disease.

## 2018-02-17 ENCOUNTER — Ambulatory Visit: Payer: Medicaid Other | Attending: Primary Care | Admitting: Physical Therapy

## 2018-02-17 ENCOUNTER — Encounter: Payer: Self-pay | Admitting: Physical Therapy

## 2018-02-17 DIAGNOSIS — M7631 Iliotibial band syndrome, right leg: Secondary | ICD-10-CM | POA: Diagnosis present

## 2018-02-17 NOTE — Therapy (Signed)
[facility] [address] [place], [address] Phone: [phone]   Fax:  [phone]  Physical Therapy Treatment  Patient Details  Name: Ebony Reeves MRN: [id number] Date of Birth: Mar 08, 1965 Referring Provider (PT): Sandrea Hughs   Encounter Date: 02/17/2018  PT End of Session - 02/17/18 1747    Visit Number  1    Number of Visits  17    Date for PT Re-Evaluation  04/14/18    PT Start Time  0515    PT Stop Time  0615    PT Time Calculation (min)  60 min    Activity Tolerance  Patient tolerated treatment well    Behavior During Therapy  [facility] for tasks assessed/performed       Past Medical History:  Diagnosis Date  . Arthritis   . 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 GERD (gastroesophageal reflux disease)   . Hypothyroidism     Past Surgical History:  Procedure Laterality Date  . ABDOMINAL EXPOSURE N/A 11/04/2012   Procedure: ABDOMINAL EXPOSURE;  Surgeon: Larina Earthly, MD;  [facility];  Service: Vascular;  Laterality: N/A;  . ANTERIOR AND POSTERIOR SPINAL FUSION N/A 11/04/2012   Procedure: Lumbar 4-5 anterior lumbar interbody fusion with instrumentation and allograft; Posterior spinal fusion with instrumentation;  Surgeon: Emilee Hero, MD;  Location: [facility] OR;  Service: Orthopedics;  Laterality: N/A;  Lumbar 4-5 anterior lumbar interbody fusion with instrumentation and allograft; Posterior spinal fusion with instrumentation  . CHOLECYSTECTOMY  1995   Gall Bladder  . TONSILLECTOMY      There were no vitals filed for this visit.  Subjective Assessment - 02/17/18 1725    Subjective  R hip/knee pain, LE weakness    Pertinent History  Patient 53 year old female with RLE weakness and pain. Patient reports spinal fusion 2014 of L4-5 following "50% slip of L4 on L5". Reports LEs have been weaker following this because she did not recieve PT, and also reports years prior  she was veryinactive d/t back pain. Patient reports recent exacerbation of R hip pain 2 months ago wiith insideous onset after beginning to walk for exercise up and down her apartment complex, and using a stair stepper. Patient reports pain is on outside of knee and hip. She says she worked on trigger points in her lateral hip and glute and that seems to relieve her pain some. Patient is currently unable to lay on her R side d/t pain, decreased independence with stair ambulation, and reports increased pain with increased walking, and sit to stand transfers. She says with lowering to sitting, she gets to a point where she "plops" becuase her legs do not feel strong enough to support. Patient reports her knee/hip pain was worse a few months ago, and has gotten better with worst pain over the past week 3/10, best 0/10. Patient reports her pain deficts are standing from prolonged sitting and just general LE weakness that she is noticing. Patient is currently on disability from work, but that she enjoys to craft and has trouble standing from her crafting table.  Pt denies N/V, B&B changes, unexplained weight fluctuation, saddle paresthesia, fever, night sweats, or unrelenting night pain at this time.    How long can you sit comfortably?  No pain with sitting, but pain with lowering to sit position    How long can you stand comfortably?  unlimited  How long can you walk comfortably?  1 hour    Diagnostic tests  none to date    Patient Stated Goals  Increase LE strength    Currently in Pain?  Yes    Pain Score  0-No pain    Pain Location  Hip    Pain Orientation  Lateral;Right    Pain Descriptors / Indicators  Sharp;Nagging;Throbbing    Pain Type  Chronic pain    Pain Radiating Towards  Pain in hip and knee, does not radiate between     Pain Onset  More than a month ago    Pain Frequency  Intermittent    Aggravating Factors   Standing from prolonged sitting, prolonged walking    Pain Relieving Factors   Rest    Effect of Pain on Daily Activities  unable to complete STS transfers without pain    Multiple Pain Sites  No           OBJECTIVE  Mental Status Patient is oriented to person, place and time.  Recent memory is intact.  Remote memory is intact.  Attention span and concentration are intact.  Expressive speech is intact.  Patient's fund of knowledge is within normal limits for educational level.  SENSATION: Grossly intact to light touch bilateral L as determined by testing dermatomes L2-S2 Proprioception and hot/cold testing deferred on this date   MUSCULOSKELETAL: Tremor: None Bulk: Normal Tone: Normal  Gait Decreased trunk rotation with ambulation. Slight bilat trendelenburg Stairs: with ascent decreased RLE push off with quick L footswing to foot flat with reciprocal pattern Descent: unnable to control LLE lowering with RLE stance resulting in full body/pelvis rotation to L for compensation   Palpation trigger points with audible pain and grimace with palpation to superior glute fibers along glute med on R , and at R GT, and distal ITB insertion       Strength (out of 5) R/L 4+4+ Hip flexion 3+/4 Hip ER 4+/4+ Hip IR 4+/4+ Hip abduction  4/5 Hip adduction 5/5 Hip extension 4+/4+ Knee extension 5/5 Knee flexion 5/5 Ankle dorsiflexion *Indicates pain   AROM (degrees) Hip IR nearly 100% limited bilat Hip ER 50% limited LLE; 75%  Limited RLE All other BLE and lumbar motions wnl with some pain with L lumbar rotation *Indicates pain  PROM (degrees) PROM = AROM  SPECIAL TESTS Slump:negative bilat SLR: Negative bilat FABER:  Positive bilat FADIR:  L Negative R Positive  Hip scour: negative bilat Ely: Positive bilat with pain on R at lateral thigh Thomas: Positive bilat with pain on R at lateral thigh Ober: positive on R 5xSTS 18sec Lateral Step-Down Test: unable RLE; lower 4in with proper technique on LLE   Ther-Ex Attempted "ITB stretch" in  standing, patient reports this is not helpful and has trouble balancing Seated glute stretch FABER and FADIR 30sec hold each Modified Thomas stretch 30sec hold Education on ITB syndrome and tension in glute med causing increased tension through ITB with irritation at insertions. Education on LE and core strengthening to increase stability; verbalizes understanding of all provided education.                             PT Education - 02/17/18 1746    Education Details  Patient was educated on diagnosis, anatomy and pathology involved, prognosis, role of PT, and was given an HEP, demonstrating exercise with proper form following verbal and tactile cues, and was given a  paper hand out to continue exercise at home. Pt was educated on and agreed to plan of care.    Person(s) Educated  Patient    Methods  Explanation;Demonstration;Tactile cues;Verbal cues;Handout    Comprehension  Verbalized understanding;Verbal cues required;Returned demonstration;Tactile cues required       PT Short Term Goals - 02/18/18 1347      PT SHORT TERM GOAL #1   Title  Pt will be independent with HEP in order to improve strength and decrease back pain in order to improve pain-free function at home and work.     Time  4    Period  Weeks    Status  New        PT Long Term Goals - 02/18/18 1347      PT LONG TERM GOAL #1   Title  Pt will decrease 5TSTS by at least 3 seconds in order to demonstrate clinically significant improvement in LE strength    Baseline  02/17/18 18sec    Time  8    Period  Weeks    Status  New      PT LONG TERM GOAL #2   Title  Pt will decrease worst hip/knee pain as reported on NPRS by at least 2 points in order to demonstrate clinically significant reduction in back pain.     Baseline  02/17/18 3/10    Time  8    Period  Weeks    Status  New      PT LONG TERM GOAL #3   Title  Patient will demonstrate normal stair ambulation without deficits or compensation     Baseline  02/17/18 ascent decreased RLE push off with quick L footswing to foot flat with reciprocal pattern. 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 Patient with impairments in bilat hip motion R>L, bilat hip strength R>L, core strength, pain, soft tissue restrictions and trigger points, and decreased motor control. Patient limited in transfer activities, and prolonged walking/standing; inhibiting full participation in her ADLs and hobby ofr crafting. Would benefit from skilled PT to address above deficits and promote optimal return to PLOF    Clinical Presentation  Evolving    Clinical Presentation due to:  Moderate (evolving): 1-2 personal factors/comorbidities, 3 or more body systems/activity limitations/participation restrictions    Clinical Decision Making  Moderate    Rehab Potential  Good    PT Frequency  2x / week    PT Duration  8 weeks    PT Treatment/Interventions  Aquatic Therapy;Electrical Stimulation;Cryotherapy;Iontophoresis 4mg /ml Dexamethasone;Moist Heat;Traction;Ultrasound;Therapeutic activities;Functional mobility training;Stair training;Therapeutic exercise;Gait training;Balance training;Neuromuscular re-education;Patient/family education;Manual techniques;Passive range of motion;Dry needling;Taping    PT Next Visit Plan  HEP review;     PT Home Exercise Plan  Seated glute stretch in  FABER and FADIR, modified thomas strech    Consulted and Agree with Plan of  Care  Patient       Patient will benefit from skilled therapeutic intervention in order to improve the following deficits and impairments:  Pain, Improper body mechanics, Impaired UE functional use, Increased fascial restricitons, Decreased strength, Decreased range of motion, Decreased endurance, Decreased activity tolerance, Abnormal gait, Decreased balance, Decreased mobility, Difficulty walking, Impaired flexibility, Increased muscle spasms, Postural dysfunction  Visit Diagnosis: Iliotibial band syndrome of right side     Problem List Patient Active Problem List   Diagnosis Date Noted  . Pre-operative cardiovascular examination 11/03/2012   Staci Acosta PT, DPT Staci Acosta 02/18/2018, 1:59 PM  [facility] PHYS[facility] AND SPORTS MEDICINE [address] [place], [address] Phone: [phone]   Fax:  [phone]  Name: ELZORA BLASKI MRN: [id number] Date of Birth: 1965-10-15

## 2018-02-19 ENCOUNTER — Ambulatory Visit: Payer: Medicaid Other | Admitting: Physical Therapy

## 2018-02-24 ENCOUNTER — Ambulatory Visit: Payer: Medicaid Other | Admitting: Physical Therapy

## 2018-02-24 ENCOUNTER — Encounter: Payer: Self-pay | Admitting: Physical Therapy

## 2018-02-24 DIAGNOSIS — M7631 Iliotibial band syndrome, right leg: Secondary | ICD-10-CM

## 2018-02-24 NOTE — Therapy (Signed)
[facility] [address], [place], [address] Phone: [phone]   Fax:  [phone]  Physical Therapy Treatment  Patient Details  Name: Ebony Reeves MRN: [id number] Date of Birth: 1965/12/25 Referring Provider (PT): Sandrea Hughs   Encounter Date: 02/24/2018  PT End of Session - 02/24/18 1444    Visit Number  2    Number of Visits  17    Date for PT Re-Evaluation  04/14/18    PT Start Time  0230    PT Stop Time  0315    PT Time Calculation (min)  45 min    Activity Tolerance  Patient tolerated treatment well    Behavior During Therapy  [facility] for tasks assessed/performed       Past Medical History:  Diagnosis Date  . Arthritis   . 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 GERD (gastroesophageal reflux disease)   . Hypothyroidism     Past Surgical History:  Procedure Laterality Date  . ABDOMINAL EXPOSURE N/A 11/04/2012   Procedure: ABDOMINAL EXPOSURE;  Surgeon: Larina Earthly, MD;  Location: [facility] OR;  Service: Vascular;  Laterality: N/A;  . ANTERIOR AND POSTERIOR SPINAL FUSION N/A 11/04/2012   Procedure: Lumbar 4-5 anterior lumbar interbody fusion with instrumentation and allograft; Posterior spinal fusion with instrumentation;  Surgeon: Emilee Hero, MD;  Location: [facility] OR;  Service: Orthopedics;  Laterality: N/A;  Lumbar 4-5 anterior lumbar interbody fusion with instrumentation and allograft; Posterior spinal fusion with instrumentation  . CHOLECYSTECTOMY  1995   Gall Bladder  . TONSILLECTOMY      There were no vitals filed for this visit.  Subjective Assessment - 02/24/18 1436    Subjective  Patient reports 4/10 pain today that she reports is worse when she is walking. Reports compliance with her HEP     Pertinent History  Patient 53 year old female with RLE weakness and pain. Patient reports spinal fusion 2014 of L4-5 following "50% slip of L4 on L5". Reports LEs have been  weaker following this because she did not recieve PT, and also reports years prior she was veryinactive d/t back pain. Patient reports recent exacerbation of R hip pain 2 months ago wiith insideous onset after beginning to walk for exercise up and down her apartment complex, and using a stair stepper. Patient reports pain is on outside of knee and hip. She says she worked on trigger points in her lateral hip and glute and that seems to relieve her pain some. Patient is currently unable to lay on her R side d/t pain, decreased independence with stair ambulation, and reports increased pain with increased walking, and sit to stand transfers. She says with lowering to sitting, she gets to a point where she "plops" becuase her legs do not feel strong enough to support. Patient reports her knee/hip pain was worse a few months ago, and has gotten better with worst pain over the past week 3/10, best 0/10. Patient reports her pain deficts are standing from prolonged sitting and just general LE weakness that she is noticing. Patient is currently on disability from work, but that she enjoys to craft and has trouble standing from her crafting table.  Pt denies N/V, B&B changes, unexplained weight fluctuation, saddle paresthesia, fever, night sweats, or unrelenting night pain at this time.    How long can you sit comfortably?  No pain with sitting, but pain with lowering to sit  position    How long can you stand comfortably?  unlimited    How long can you walk comfortably?  1 hour    Diagnostic tests  none to date    Patient Stated Goals  Increase LE strength    Pain Onset  More than a month ago       Manual STM with trigger point release to glute med (focus on superior fibers)   ESTIM + heat pack Hi[employer] ESTIM 10 min at patient tolerated 195V increased to 120V through treatment at glute med area . Attempted to decrease muscle tension in this area. With PT assessing patient tolerance throughout (increasing intensity  as needed), monitoring skin integrity (normal), with decreased pain noted from patient Educated patient on glute med muscle location and attachments for explanation of lateral hip pain. Education on heat as a modality to decrease muscle tension   Ther-Ex - Sidelying clamshell x10; YTB 2x 10 with max cuing initially for proper form, with good carry over following - Review of seated glute stretches 30sec with min correction needed for proper form. Education on continuing HEP stretching for maintenance of session gains                      PT Education - 02/24/18 1443    Education Details  self massage; ESTIM education    Person(s) Educated  Patient    Methods  Explanation    Comprehension  Verbalized understanding       PT Short Term Goals - 02/18/18 1347      PT SHORT TERM GOAL #1   Title  Pt will be independent with HEP in order to improve strength and decrease back pain in order to improve pain-free function at home and work.     Time  4    Period  Weeks    Status  New        PT Long Term Goals - 02/18/18 1347      PT LONG TERM GOAL #1   Title  Pt will decrease 5TSTS by at least 3 seconds in order to demonstrate clinically significant improvement in LE strength    Baseline  02/17/18 18sec    Time  8    Period  Weeks    Status  New      PT LONG TERM GOAL #2   Title  Pt will decrease worst hip/knee pain as reported on NPRS by at least 2 points in order to demonstrate clinically significant reduction in back pain.     Baseline  02/17/18 3/10    Time  8    Period  Weeks    Status  New      PT LONG TERM GOAL #3   Title  Patient will demonstrate normal stair ambulation without deficits or compensation    Baseline  02/17/18 ascent decreased RLE push off with quick L footswing to foot flat with reciprocal pattern. Descent: unnable to control LLE lowering with RLE stance resulting in full body/pelvis rotation to L for compensation    Time  8    Period  Weeks     Status  New      PT LONG TERM GOAL #4   Title  Patient will increase FOTO score to 46 to demonstrate predicted increase in functional mobility to complete ADLs    Baseline  02/17/18 61    Time  8    Period  Weeks    Status  New  Plan - 02/24/18 1557    Clinical Impression Statement  Following manual and modality techniques patient reports 0/10 pain with sitting/standing and walking, which she is happy with. PT reviewed HEP to ensure accuracy and encouraged patient to continue this for maintainence of session gains; patient verbalized understanding.     Rehab Potential  Good    PT Frequency  2x / week    PT Duration  8 weeks    PT Treatment/Interventions  Aquatic Therapy;Electrical Stimulation;Cryotherapy;Iontophoresis 4mg /ml Dexamethasone;Moist Heat;Traction;Ultrasound;Therapeutic activities;Functional mobility training;Stair training;Therapeutic exercise;Gait training;Balance training;Neuromuscular re-education;Patient/family education;Manual techniques;Passive range of motion;Dry needling;Taping    PT Next Visit Plan  HEP review;     Consulted and Agree with Plan of Care  Patient       Patient will benefit from skilled therapeutic intervention in order to improve the following deficits and impairments:  Pain, Improper body mechanics, Impaired UE functional use, Increased fascial restricitons, Decreased strength, Decreased range of motion, Decreased endurance, Decreased activity tolerance, Abnormal gait, Decreased balance, Decreased mobility, Difficulty walking, Impaired flexibility, Increased muscle spasms, Postural dysfunction  Visit Diagnosis: Iliotibial band syndrome of right side     Problem List Patient Active Problem List   Diagnosis Date Noted  . Pre-operative cardiovascular examination 11/03/2012   Staci Acostahelsea Miller PT, DPT Staci Acostahelsea Miller 02/24/2018, 3:58 PM  [place] [facility] [address], [place], [address] Phone: [phone]   Fax:  [phone]  Name: Ebony Reeves MRN: [id number] Date of Birth: [birth year]

## 2018-03-03 ENCOUNTER — Encounter: Payer: Medicaid Other | Admitting: Physical Therapy

## 2018-03-03 ENCOUNTER — Ambulatory Visit: Payer: Medicaid Other | Admitting: Physical Therapy

## 2018-03-05 ENCOUNTER — Encounter: Payer: Self-pay | Admitting: Physical Therapy

## 2018-03-05 ENCOUNTER — Ambulatory Visit: Payer: Medicaid Other | Admitting: Physical Therapy

## 2018-03-05 DIAGNOSIS — M7631 Iliotibial band syndrome, right leg: Secondary | ICD-10-CM | POA: Diagnosis not present

## 2018-03-05 NOTE — Therapy (Signed)
[facility] [address] [place], [address] Phone: [phone]   Fax:  [phone]  Physical Therapy Treatment  Patient Details  Name: ARRIAN BRIDGER MRN: [id number] Date of Birth: May 03, 1965 Referring Provider (PT): Sandrea Hughs   Encounter Date: 03/05/2018  PT End of Session - 03/05/18 1323    Visit Number  3    Number of Visits  17    Date for PT Re-Evaluation  04/14/18    PT Start Time  0100    PT Stop Time  0145    PT Time Calculation (min)  45 min    Activity Tolerance  Patient tolerated treatment well    Behavior During Therapy  [facility] for tasks assessed/performed       Past Medical History:  Diagnosis Date  . Arthritis   . 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 GERD (gastroesophageal reflux disease)   . Hypothyroidism     Past Surgical History:  Procedure Laterality Date  . ABDOMINAL EXPOSURE N/A 11/04/2012   Procedure: ABDOMINAL EXPOSURE;  Surgeon: Larina Earthly, MD;  Location: [facility] OR;  Service: Vascular;  Laterality: N/A;  . ANTERIOR AND POSTERIOR SPINAL FUSION N/A 11/04/2012   Procedure: Lumbar 4-5 anterior lumbar interbody fusion with instrumentation and allograft; Posterior spinal fusion with instrumentation;  Surgeon: Emilee Hero, MD;  Location: [facility] OR;  Service: Orthopedics;  Laterality: N/A;  Lumbar 4-5 anterior lumbar interbody fusion with instrumentation and allograft; Posterior spinal fusion with instrumentation  . CHOLECYSTECTOMY  1995   Gall Bladder  . TONSILLECTOMY      There were no vitals filed for this visit.  Subjective Assessment - 03/05/18 1302    Subjective  Patient reports she felt much better after she left last session, and felt good for a few hours. Patient reports the ESTIM and heat was helpful last session. Patient reports her pain today is 4/10 with no pain at rest.     Pertinent History  Patient 53 year old female with RLE weakness  and pain. Patient reports spinal fusion 2014 of L4-5 following "50% slip of L4 on L5". Reports LEs have been weaker following this because she did not recieve PT, and also reports years prior she was veryinactive d/t back pain. Patient reports recent exacerbation of R hip pain 2 months ago wiith insideous onset after beginning to walk for exercise up and down her apartment complex, and using a stair stepper. Patient reports pain is on outside of knee and hip. She says she worked on trigger points in her lateral hip and glute and that seems to relieve her pain some. Patient is currently unable to lay on her R side d/t pain, decreased independence with stair ambulation, and reports increased pain with increased walking, and sit to stand transfers. She says with lowering to sitting, she gets to a point where she "plops" becuase her legs do not feel strong enough to support. Patient reports her knee/hip pain was worse a few months ago, and has gotten better with worst pain over the past week 3/10, best 0/10. Patient reports her pain deficts are standing from prolonged sitting and just general LE weakness that she is noticing. Patient is currently on disability from work, but that she enjoys to craft and has trouble standing from her crafting table.  Pt denies N/V, B&B changes, unexplained weight fluctuation, saddle paresthesia, fever, night sweats, or unrelenting night pain at this  time.    How long can you sit comfortably?  No pain with sitting, but pain with lowering to sit position    How long can you stand comfortably?  unlimited    How long can you walk comfortably?  1 hour    Diagnostic tests  none to date    Patient Stated Goals  Increase LE strength    Pain Onset  More than a month ago          Manual STM with trigger point release to glute med (focus on superior fibers)  ESTIM+ heat packHiVolt ESTIM10 min at patient tolerated160Vincreased to190V through treatmentat glute medarea.  Attempted to decrease muscle tension in this area. With PT assessing patient tolerance throughout (increasing intensity as needed), monitoring skin integrity (normal), with decreased pain noted from patient Educated patient on glute med muscle location and attachments for explanation of lateral hip pain. Education on progression to increase glute strength and correct muscle patterns following pain management, anatomy and purpose of therex interventions   Ther-Ex - Sidelying isometric hip abd with gait belt 3x 5 with 10sec holds (attempted sidelying abd, unable d/t pain) - Sidelying YTB 3x 10 with min cuing for hip positioning initially with good carry over following - Bridge with red tband at knees for abd activation with good carry over following cuing, with 50% ROM d/t glute weakness - STS x10 with good carry over of glute and core activation and maintained "knees over toes" without valgus                      PT Education - 03/05/18 1323    Education Details  Exercise form    Person(s) Educated  Patient    Methods  Explanation;Verbal cues;Tactile cues    Comprehension  Verbalized understanding;Verbal cues required;Tactile cues required       PT Short Term Goals - 02/18/18 1347      PT SHORT TERM GOAL #1   Title  Pt will be independent with HEP in order to improve strength and decrease back pain in order to improve pain-free function at home and work.     Time  4    Period  Weeks    Status  New        PT Long Term Goals - 02/18/18 1347      PT LONG TERM GOAL #1   Title  Pt will decrease 5TSTS by at least 3 seconds in order to demonstrate clinically significant improvement in LE strength    Baseline  02/17/18 18sec    Time  8    Period  Weeks    Status  New      PT LONG TERM GOAL #2   Title  Pt will decrease worst hip/knee pain as reported on NPRS by at least 2 points in order to demonstrate clinically significant reduction in back pain.     Baseline  02/17/18  3/10    Time  8    Period  Weeks    Status  New      PT LONG TERM GOAL #3   Title  Patient will demonstrate normal stair ambulation without deficits or compensation    Baseline  02/17/18 ascent decreased RLE push off with quick L footswing to foot flat with reciprocal pattern. Descent: unnable to control LLE lowering with RLE stance resulting in full body/pelvis rotation to L for compensation    Time  8    Period  Weeks  Status  New      PT LONG TERM GOAL #4   Title  Patient will increase FOTO score to 46 to demonstrate predicted increase in functional mobility to complete ADLs    Baseline  02/17/18 61    Time  8    Period  Weeks    Status  New            Plan - 03/05/18 1425    Clinical Impression Statement  Following manual and modality treatment patient reports no pain. Patient is able to complete therex with accuracy following modification and cuing. Patient is continuing to ave pain, inhibiting therex progression and muscle activiaton. Patient will continue to benefit from skilled PT for pain management and to continue to attempt strengthening progression as pain allows.     Rehab Potential  Good    PT Frequency  2x / week    PT Duration  8 weeks    PT Treatment/Interventions  Aquatic Therapy;Electrical Stimulation;Cryotherapy;Iontophoresis 4mg /ml Dexamethasone;Moist Heat;Traction;Ultrasound;Therapeutic activities;Functional mobility training;Stair training;Therapeutic exercise;Gait training;Balance training;Neuromuscular re-education;Patient/family education;Manual techniques;Passive range of motion;Dry needling;Taping    PT Next Visit Plan  continue to attempt LE strengthening    PT Home Exercise Plan  Seated glute stretch in FABER and FADIR, modified thomas strech    Consulted and Agree with Plan of Care  Patient       Patient will benefit from skilled therapeutic intervention in order to improve the following deficits and impairments:  Pain, Improper body mechanics,  Impaired UE functional use, Increased fascial restricitons, Decreased strength, Decreased range of motion, Decreased endurance, Decreased activity tolerance, Abnormal gait, Decreased balance, Decreased mobility, Difficulty walking, Impaired flexibility, Increased muscle spasms, Postural dysfunction  Visit Diagnosis: Iliotibial band syndrome of right side     Problem List Patient Active Problem List   Diagnosis Date Noted  . Pre-operative cardiovascular examination 11/03/2012   Staci Acosta PT, DPT Staci Acosta 03/05/2018, 2:52 PM  [facility] PHYSICAL AND SPORTS MEDICINE [address], [place], [address] Phone: [phone]   Fax:  [phone]  Name: TYNESHA FEBLES MRN: [id number] Date of Birth: Oct 03, 1965

## 2018-03-09 ENCOUNTER — Encounter: Payer: Medicaid Other | Admitting: Physical Therapy

## 2018-03-10 ENCOUNTER — Ambulatory Visit: Payer: Medicaid Other | Admitting: Physical Therapy

## 2018-03-12 ENCOUNTER — Ambulatory Visit: Payer: Medicaid Other | Admitting: Physical Therapy

## 2018-03-17 ENCOUNTER — Ambulatory Visit: Payer: Medicaid Other | Admitting: Physical Therapy

## 2018-03-24 ENCOUNTER — Ambulatory Visit: Payer: Medicaid Other | Attending: Primary Care | Admitting: Physical Therapy

## 2018-03-26 ENCOUNTER — Ambulatory Visit: Payer: Medicaid Other | Admitting: Physical Therapy

## 2018-03-31 ENCOUNTER — Ambulatory Visit: Payer: Medicaid Other | Admitting: Physical Therapy

## 2018-04-02 ENCOUNTER — Ambulatory Visit: Payer: Medicaid Other | Admitting: Physical Therapy

## 2018-04-07 ENCOUNTER — Ambulatory Visit: Payer: Medicaid Other | Admitting: Physical Therapy

## 2018-04-09 ENCOUNTER — Ambulatory Visit: Payer: Medicaid Other | Admitting: Physical Therapy

## 2018-04-28 NOTE — Therapy (Signed)
[place] [facility] MAIN [facility] SERVICES [address] [place], [place], [address] Phone: [phone]   Fax:  [phone]  Patient Details  Name: Ebony Reeves MRN: [id number] Date of Birth: 04/16/1965 Referring Provider:  No ref. provider found  Encounter Date: 04/28/2018  The [facility] outpatient clinics are closed at this time due to the COVID-19 epidemic. The patient was contacted in regards to their therapy services. The patient is in agreement that they are safe and consent to being on hold for therapy services until the [facility] outpatient facilities reopen. At which time, the patient will be contacted to schedule an appointment to resume therapy services.   Patient would like to hold off on services and perform exercises at home.    Myrene Galas, PT DPT 04/28/2018, 9:52 AM  [place] [facility] MAIN [facility] SERVICES [address] [place], [place], [address] Phone: [phone]   Fax:  [phone]

## 2018-12-15 DIAGNOSIS — M7631 Iliotibial band syndrome, right leg: Secondary | ICD-10-CM | POA: Insufficient documentation

## 2019-12-21 ENCOUNTER — Other Ambulatory Visit: Payer: Self-pay | Admitting: Primary Care

## 2019-12-21 DIAGNOSIS — Z1231 Encounter for screening mammogram for malignant neoplasm of breast: Secondary | ICD-10-CM

## 2020-02-09 ENCOUNTER — Ambulatory Visit (INDEPENDENT_AMBULATORY_CARE_PROVIDER_SITE_OTHER): Payer: Medicaid Other

## 2020-02-09 ENCOUNTER — Ambulatory Visit (INDEPENDENT_AMBULATORY_CARE_PROVIDER_SITE_OTHER): Payer: Medicaid Other | Admitting: Podiatry

## 2020-02-09 ENCOUNTER — Other Ambulatory Visit: Payer: Self-pay

## 2020-02-09 ENCOUNTER — Encounter: Payer: Self-pay | Admitting: Podiatry

## 2020-02-09 DIAGNOSIS — M722 Plantar fascial fibromatosis: Secondary | ICD-10-CM | POA: Diagnosis not present

## 2020-02-09 DIAGNOSIS — M7662 Achilles tendinitis, left leg: Secondary | ICD-10-CM

## 2020-02-09 MED ORDER — TRIAMCINOLONE ACETONIDE 10 MG/ML IJ SUSP
5.0000 mg | Freq: Once | INTRAMUSCULAR | Status: AC
  Administered 2020-02-10: 5 mg

## 2020-02-09 MED ORDER — DEXAMETHASONE SODIUM PHOSPHATE 4 MG/ML IJ SOLN
2.0000 mg | Freq: Once | INTRAMUSCULAR | Status: AC
  Administered 2020-02-10: 2 mg

## 2020-02-09 NOTE — Patient Instructions (Signed)
A physical therapy referral has been sent to: [facility] [address],  [place]  [address]  Main: [phone]  Call the number above to schedule your appointment    Look for Voltaren gel at the pharmacy over the counter or online (also known as diclofenac 1% gel). Apply to the painful areas 3-4x daily with the supplied dosing card. Allow to dry for 10 minutes before going into socks/shoes   Plantar Fasciitis (Heel Spur Syndrome) with Rehab The plantar fascia is a fibrous, ligament-like, soft-tissue structure that spans the bottom of the foot. Plantar fasciitis is a condition that causes pain in the foot due to inflammation of the tissue. SYMPTOMS   Pain and tenderness on the underneath side of the foot.  Pain that worsens with standing or walking. CAUSES  Plantar fasciitis is caused by irritation and injury to the plantar fascia on the underneath side of the foot. Common mechanisms of injury include:  Direct trauma to bottom of the foot.  Damage to a small nerve that runs under the foot where the main fascia attaches to the heel bone.  Stress placed on the plantar fascia due to bone spurs. RISK INCREASES WITH:   Activities that place stress on the plantar fascia (running, jumping, pivoting, or cutting).  Poor strength and flexibility.  Improperly fitted shoes.  Tight calf muscles.  Flat feet.  Failure to warm-up properly before activity.  Obesity. PREVENTION  Warm up and stretch properly before activity.  Allow for adequate recovery between workouts.  Maintain physical fitness:  Strength, flexibility, and endurance.  Cardiovascular fitness.  Maintain a health body weight.  Avoid stress on the plantar fascia.  Wear properly fitted shoes, including arch supports for individuals who have flat feet.  PROGNOSIS  If treated properly, then the symptoms of plantar fasciitis usually resolve without surgery. However,  occasionally surgery is necessary.  RELATED COMPLICATIONS   Recurrent symptoms that may result in a chronic condition.  Problems of the lower back that are caused by compensating for the injury, such as limping.  Pain or weakness of the foot during push-off following surgery.  Chronic inflammation, scarring, and partial or complete fascia tear, occurring more often from repeated injections.  TREATMENT  Treatment initially involves the use of ice and medication to help reduce pain and inflammation. The use of strengthening and stretching exercises may help reduce pain with activity, especially stretches of the Achilles tendon. These exercises may be performed at home or with a therapist. Your caregiver may recommend that you use heel cups of arch supports to help reduce stress on the plantar fascia. Occasionally, corticosteroid injections are given to reduce inflammation. If symptoms persist for greater than 6 months despite non-surgical (conservative), then surgery may be recommended.   MEDICATION   If pain medication is necessary, then nonsteroidal anti-inflammatory medications, such as aspirin and ibuprofen, or other minor pain relievers, such as acetaminophen, are often recommended.  Do not take pain medication within 7 days before surgery.  Prescription pain relievers may be given if deemed necessary by your caregiver. Use only as directed and only as much as you need.  Corticosteroid injections may be given by your caregiver. These injections should be reserved for the most serious cases, because they may only be given a certain number of times.  HEAT AND COLD  Cold treatment (icing) relieves pain and reduces inflammation. Cold treatment should be applied for 10 to 15 minutes every 2 to 3 hours for inflammation  and pain and immediately after any activity that aggravates your symptoms. Use ice packs or massage the area with a piece of ice (ice massage).  Heat treatment may be used  prior to performing the stretching and strengthening activities prescribed by your caregiver, physical therapist, or athletic trainer. Use a heat pack or soak the injury in warm water.  SEEK IMMEDIATE MEDICAL CARE IF:  Treatment seems to offer no benefit, or the condition worsens.  Any medications produce adverse side effects.  EXERCISES- RANGE OF MOTION (ROM) AND STRETCHING EXERCISES - Plantar Fasciitis (Heel Spur Syndrome) These exercises may help you when beginning to rehabilitate your injury. Your symptoms may resolve with or without further involvement from your physician, physical therapist or athletic trainer. While completing these exercises, remember:   Restoring tissue flexibility helps normal motion to return to the joints. This allows healthier, less painful movement and activity.  An effective stretch should be held for at least 30 seconds.  A stretch should never be painful. You should only feel a gentle lengthening or release in the stretched tissue.  RANGE OF MOTION - Toe Extension, Flexion  Sit with your right / left leg crossed over your opposite knee.  Grasp your toes and gently pull them back toward the top of your foot. You should feel a stretch on the bottom of your toes and/or foot.  Hold this stretch for 10 seconds.  Now, gently pull your toes toward the bottom of your foot. You should feel a stretch on the top of your toes and or foot.  Hold this stretch for 10 seconds. Repeat  times. Complete this stretch 3 times per day.   RANGE OF MOTION - Ankle Dorsiflexion, Active Assisted  Remove shoes and sit on a chair that is preferably not on a carpeted surface.  Place right / left foot under knee. Extend your opposite leg for support.  Keeping your heel down, slide your right / left foot back toward the chair until you feel a stretch at your ankle or calf. If you do not feel a stretch, slide your bottom forward to the edge of the chair, while still keeping your  heel down.  Hold this stretch for 10 seconds. Repeat 3 times. Complete this stretch 2 times per day.   STRETCH  Gastroc, Standing  Place hands on wall.  Extend right / left leg, keeping the front knee somewhat bent.  Slightly point your toes inward on your back foot.  Keeping your right / left heel on the floor and your knee straight, shift your weight toward the wall, not allowing your back to arch.  You should feel a gentle stretch in the right / left calf. Hold this position for 10 seconds. Repeat 3 times. Complete this stretch 2 times per day.  STRETCH  Soleus, Standing  Place hands on wall.  Extend right / left leg, keeping the other knee somewhat bent.  Slightly point your toes inward on your back foot.  Keep your right / left heel on the floor, bend your back knee, and slightly shift your weight over the back leg so that you feel a gentle stretch deep in your back calf.  Hold this position for 10 seconds. Repeat 3 times. Complete this stretch 2 times per day.  STRETCH  Gastrocsoleus, Standing  Note: This exercise can place a lot of stress on your foot and ankle. Please complete this exercise only if specifically instructed by your caregiver.   Place the ball of your  right / left foot on a step, keeping your other foot firmly on the same step.  Hold on to the wall or a rail for balance.  Slowly lift your other foot, allowing your body weight to press your heel down over the edge of the step.  You should feel a stretch in your right / left calf.  Hold this position for 10 seconds.  Repeat this exercise with a slight bend in your right / left knee. Repeat 3 times. Complete this stretch 2 times per day.   STRENGTHENING EXERCISES - Plantar Fasciitis (Heel Spur Syndrome)  These exercises may help you when beginning to rehabilitate your injury. They may resolve your symptoms with or without further involvement from your physician, physical therapist or athletic  trainer. While completing these exercises, remember:   Muscles can gain both the endurance and the strength needed for everyday activities through controlled exercises.  Complete these exercises as instructed by your physician, physical therapist or athletic trainer. Progress the resistance and repetitions only as guided.  STRENGTH - Towel Curls  Sit in a chair positioned on a non-carpeted surface.  Place your foot on a towel, keeping your heel on the floor.  Pull the towel toward your heel by only curling your toes. Keep your heel on the floor. Repeat 3 times. Complete this exercise 2 times per day.  STRENGTH - Ankle Inversion  Secure one end of a rubber exercise band/tubing to a fixed object (table, pole). Loop the other end around your foot just before your toes.  Place your fists between your knees. This will focus your strengthening at your ankle.  Slowly, pull your big toe up and in, making sure the band/tubing is positioned to resist the entire motion.  Hold this position for 10 seconds.  Have your muscles resist the band/tubing as it slowly pulls your foot back to the starting position. Repeat 3 times. Complete this exercises 2 times per day.  Document Released: 12/31/2004 Document Revised: 03/25/2011 Document Reviewed: 04/14/2008 [facility] Patient Information 2014 [place], [place].

## 2020-02-10 DIAGNOSIS — M722 Plantar fascial fibromatosis: Secondary | ICD-10-CM | POA: Diagnosis not present

## 2020-02-10 NOTE — Progress Notes (Signed)
  Subjective:  Patient ID: Ebony Reeves, female    DOB: Jun 12, 1965,  MRN: [id number]  Chief Complaint  Patient presents with  . Foot Pain    Patient presents today for bilat heel pain x 1 month    55 y.o. female presents with the above complaint. History confirmed with patient.  The right hurts worse than the left.  The pain in the right is on the bottom of the heel, the pain left in the back of the heel.  She has tried stretching, ice and over-the-counter insoles.  Objective:  Physical Exam: warm, good capillary refill, no trophic changes or ulcerative lesions, normal DP and PT pulses and normal sensory exam. Left Foot: tenderness at Achilles tendon insertion  Right Foot: point tenderness over the heel pad   No images are attached to the encounter.  Radiographs: X-ray of both feet: No fracture dislocation noted, there is a posterior calcaneal enthesophyte on the left side and a plantar calcaneal enthesophyte that is quite small on the right side Assessment:   1. Plantar fasciitis   2. Achilles tendinitis of left lower extremity      Plan:  Patient was evaluated and treated and all questions answered.  Discussed the etiology and treatment options for plantar fasciitis and Achilles tendinitis including stretching, formal physical therapy, supportive shoegears such as a running shoe or sneaker, pre fabricated orthoses, injection therapy, and oral medications. We also discussed the role of surgical treatment of this for patients who do not improve after exhausting non-surgical treatment options.   Plantar Fasciitis and Achilles tendinitis -XR reviewed with patient -Educated patient on stretching and icing of the affected limb -Injection delivered to the plantar fascia of the right foot. -Rx for meloxicam. Educated on use, risks and benefits of the medication  -Physical therapy referral sent -Continues with night splints which she has at home   Return in about 6 weeks (around  03/22/2020) for re-check Achilles tendon on left , recheck plantar fasciitis on right.

## 2020-03-07 ENCOUNTER — Other Ambulatory Visit: Payer: Self-pay

## 2020-03-07 ENCOUNTER — Ambulatory Visit: Payer: Medicaid Other | Attending: Gastroenterology

## 2020-03-07 DIAGNOSIS — M79671 Pain in right foot: Secondary | ICD-10-CM | POA: Diagnosis not present

## 2020-03-07 DIAGNOSIS — R29898 Other symptoms and signs involving the musculoskeletal system: Secondary | ICD-10-CM | POA: Insufficient documentation

## 2020-03-07 DIAGNOSIS — M25571 Pain in right ankle and joints of right foot: Secondary | ICD-10-CM | POA: Diagnosis present

## 2020-03-07 NOTE — Therapy (Unsigned)
[facility]OR[facility] MEDICINE [address] [place], [address] Phone: [phone]   Fax:  [phone]  Physical Therapy Evaluation  Patient Details  Name: Ebony Reeves MRN: [id number] Date of Birth: 1965/11/05 No data recorded  Encounter Date: 03/07/2020   PT End of Session - 03/07/20 1450    Visit Number 1    Number of Visits 17    Date for PT Re-Evaluation 05/02/20    PT Start Time 1345    PT Stop Time 1430    PT Time Calculation (min) 45 min    Activity Tolerance Patient tolerated treatment well    Behavior During Therapy [facility] for tasks assessed/performed           Past Medical History:  Diagnosis Date  . Arthritis   . 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 GERD (gastroesophageal reflux disease)   . Hypothyroidism     Past Surgical History:  Procedure Laterality Date  . ABDOMINAL EXPOSURE N/A 11/04/2012   Procedure: ABDOMINAL EXPOSURE;  Surgeon: Larina Earthly, MD;  Location: [facility] OR;  Service: Vascular;  Laterality: N/A;  . ANTERIOR AND POSTERIOR SPINAL FUSION N/A 11/04/2012   Procedure: Lumbar 4-5 anterior lumbar interbody fusion with instrumentation and allograft; Posterior spinal fusion with instrumentation;  Surgeon: Emilee Hero, MD;  Location: [facility] OR;  Service: Orthopedics;  Laterality: N/A;  Lumbar 4-5 anterior lumbar interbody fusion with instrumentation and allograft; Posterior spinal fusion with instrumentation  . CHOLECYSTECTOMY  1995   Gall Bladder  . TONSILLECTOMY      There were no vitals filed for this visit.    Subjective Assessment - 03/07/20 1441    Subjective Patient reports pain in R foot that started about 3 months ago. Pain is on bottom of foot, and extends from heel to midfoot. Pain at worst is 10/10 with first couple steps in the morning, but improve with walking. Currently pain is 0/10. Pain increaes with walking, and standing on feet for a long time. Patient  has been using a night splint the last few nights and thinks that it is helping with her pain. Patient also recieved a cortisone shot about 3 weeks ago with not much improvment. Patient hopes to get back to walking for exercise and reduce pain in the mornings. No falls in last 6mo, no unexplaine night sweats or wt loss.    Limitations Standing;Walking;House hold activities    How long can you sit comfortably? Unlimited    How long can you stand comfortably? 15 min    How long can you walk comfortably? 15 min    Diagnostic tests X-Ray showed bone spur in bilateral heel    Patient Stated Goals Reduce pain, get back to walking for exercise    Currently in Pain? No/denies           Gait- Antalgic gait favoring right foot  Posture- WNL  ROM Plantarflexion- Equal/painless Dorsiflexion- Equal/painless Inversion- Equal/painless Eversion- equal/painless Toe Flexion- equal/painless Toe Extension- 25% limited/painless Great Toe Flexion- equal/painless Great Toe Extension- 25% extension/painless  Joint Mobilizations TC A-P/P-A: Grade IV- WNL, no pain Subtalar- Grade IV- WNL, no pain Talonavicular: Grade IV- Hypomobile, painless Calcaneocuboid: Grade IV- Hypomobile, Painless 1st Ray mobility: WNL, painless  Strength Plantarflexion- 4/5 bilateral Dorsiflexion- 5/5 B Inversion- 4+/5 R, 3/5 L Eversion- 4+/5R, 3+/5 L Great toe flexion- 5/5B Great toe extension- 4/5R, 5/5L Toe Flexion- 4/5B Toe Extension-4/5 B  Tests Windlass-  Painful between 1st and 2nd toe TTP- From distal heel to midfoot   Objective measurements completed on examination: See above findings.               PT Education - 03/07/20 1449    Education Details HEP/ Diagnosis and Prognosis    Person(s) Educated Patient    Methods Explanation;Demonstration;Verbal cues    Comprehension Verbalized understanding;Returned demonstration            PT Short Term Goals - 03/07/20 1501      PT SHORT TERM GOAL  #1   Title Patient will be independent with HEP to improve foot and ankle strength and decrease pain to improve function    Baseline 03/07/20- Dependent    Time 2    Period Weeks    Status New    Target Date 03/21/20             PT Long Term Goals - 03/07/20 1503      PT LONG TERM GOAL #1   Title Paitent will improve FOTO score to (Insert score) in order to show improved function    Baseline (Insert score)    Time 8    Period Weeks    Status New    Target Date 05/02/20      PT LONG TERM GOAL #2   Title Paitent will improve pain with first steps in morning to no more than 4/10    Baseline 02/16/20- 10/10    Time 8    Period Weeks    Status New    Target Date 05/02/20      PT LONG TERM GOAL #3   Title Paitent will be able to walk for 30 min without increase in pain to show improved function    Baseline 03/07/20- 10 min    Time 8    Period Weeks    Status New    Target Date 05/02/20                  Plan - 03/07/20 1450    Clinical Impression Statement Patient is 55 yo female with c/c of right foot pain and was recently diagnosed by foot doctor with plantar fasciitis. Paitent has dysfunciton with ankle/foot joint due to reduced ROM of great toe extension, TTP along medial heel, reduced strength with toe flexion/extension, and plantarflexion weakness. These limitations have lead to decreased ability to stand for long periods of time and walk for exercise. Patient will benefit from skilled therapy to return to prior level of function.    Personal Factors and Comorbidities Comorbidity 2    Comorbidities Age, obesity    Examination-Activity Limitations Stairs;Stand;Squat;Carry    Stability/Clinical Decision Making Stable/Uncomplicated    Clinical Decision Making Low    Rehab Potential Good    PT Frequency 2x / week    PT Duration 8 weeks    PT Treatment/Interventions ADLs/Self Care Home Management;Cryotherapy;Electrical Stimulation;Iontophoresis 4mg /ml  Dexamethasone;Moist Heat;Ultrasound;Gait training;Stair training;Functional mobility training;Therapeutic activities;Therapeutic exercise;Neuromuscular re-education;Dry needling;Manual techniques;Passive range of motion;Joint Manipulations    PT Next Visit Plan Check balance, continue intrinsic foot strengthening    PT Home Exercise Plan Towel Curls, Foot Doming, Calf raises    Consulted and Agree with Plan of Care Patient           Patient will benefit from skilled therapeutic intervention in order to improve the following deficits and impairments:  Abnormal gait,Decreased activity tolerance,Decreased endurance,Decreased strength,Decreased range of motion,Difficulty walking,Hypomobility,Pain  Visit Diagnosis: Pain in right foot  Pain in right ankle and joints of right foot  Weakness of right foot     Problem List Patient Active Problem List   Diagnosis Date Noted  . It band syndrome, right 12/15/2018  . Obesity, Class I, BMI 30-34.9 06/26/2017  . Severe obesity (BMI 35.0-39.9) with comorbidity (HCC) 05/14/2017  . History of sleeve gastrectomy 12/20/2015  . Postgastrectomy malabsorption 12/20/2015  . Essential hypertension 10/23/2015  . GERD (gastroesophageal reflux disease) 10/23/2015  . Hypothyroidism 10/23/2015  . History of spinal fusion 07/06/2015  . Vitamin D deficiency 07/06/2015  . Pre-operative cardiovascular examination 11/03/2012    Silvano Rusk 03/07/2020, 3:14 PM Silvano Rusk SPT  [facility] PHYSICAL AND SPORTS MEDICINE [address], [place], [address] Phone: [phone]   Fax:  [phone]  Name: JAENA BROCATO MRN: [id number] Date of Birth: December 09, 1965

## 2020-03-15 ENCOUNTER — Ambulatory Visit: Payer: Medicaid Other | Attending: Podiatry

## 2020-03-15 ENCOUNTER — Other Ambulatory Visit: Payer: Self-pay

## 2020-03-15 DIAGNOSIS — M25571 Pain in right ankle and joints of right foot: Secondary | ICD-10-CM | POA: Diagnosis present

## 2020-03-15 DIAGNOSIS — M79671 Pain in right foot: Secondary | ICD-10-CM | POA: Insufficient documentation

## 2020-03-15 DIAGNOSIS — R29898 Other symptoms and signs involving the musculoskeletal system: Secondary | ICD-10-CM | POA: Insufficient documentation

## 2020-03-15 NOTE — Therapy (Unsigned)
[facility] PHYSICAL [facility]ORTS MEDICINE [address] [place], [address] Phone: [phone]   Fax:  [phone]  Physical Therapy Treatment  Patient Details  Name: Ebony Reeves MRN: [id number] Date of Birth: 11-26-1965 No data recorded  Encounter Date: 03/15/2020   PT End of Session - 03/15/20 1656    Visit Number 2    Number of Visits 17    Date for PT Re-Evaluation 05/02/20    PT Start Time 1430    PT Stop Time 1515    PT Time Calculation (min) 45 min    Activity Tolerance Patient tolerated treatment well    Behavior During Therapy [facility] for tasks assessed/performed           Past Medical History:  Diagnosis Date  . Arthritis   . 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 GERD (gastroesophageal reflux disease)   . Hypothyroidism     Past Surgical History:  Procedure Laterality Date  . ABDOMINAL EXPOSURE N/A 11/04/2012   Procedure: ABDOMINAL EXPOSURE;  Surgeon: Larina Earthly, MD;  Location: [facility] OR;  Service: Vascular;  Laterality: N/A;  . ANTERIOR AND POSTERIOR SPINAL FUSION N/A 11/04/2012   Procedure: Lumbar 4-5 anterior lumbar interbody fusion with instrumentation and allograft; Posterior spinal fusion with instrumentation;  Surgeon: Emilee Hero, MD;  Location: [facility] OR;  Service: Orthopedics;  Laterality: N/A;  Lumbar 4-5 anterior lumbar interbody fusion with instrumentation and allograft; Posterior spinal fusion with instrumentation  . CHOLECYSTECTOMY  1995   Gall Bladder  . TONSILLECTOMY      There were no vitals filed for this visit.   Subjective Assessment - 03/15/20 1517    Subjective Pain in foot has decreased to about 3/10 in the morning with use of boot at night. HEP has been helping decrease pain. Overall more active.    Limitations Standing;Walking;House hold activities    How long can you sit comfortably? Unlimited    How long can you stand comfortably? 15 min    How long can you walk  comfortably? 15 min    Diagnostic tests X-Ray showed bone spur in bilateral heel    Patient Stated Goals Reduce pain, get back to walking for exercise    Currently in Pain? Yes    Pain Score 2     Pain Location Foot    Pain Orientation Right;Left    Pain Descriptors / Indicators Aching;Burning;Shooting;Sharp    Pain Type Chronic pain    Pain Onset More than a month ago    Pain Frequency Constant           Therapeutic Exercise  Foot doming- 1x10 Calf raise off 4in step- 2x12 Calf stretch off 4 in step- 3x30s Tandem balance on airex- 3x45s 4 way ankle- x20 BTB Isometric 4way ankle- x10 PROM plantarflexion/dorsiflexion- 3x30s hold  Therapeutic exercise to improve LE strength and decrease pain    PT Education - 03/15/20 1651    Education Details Form/technique    Person(s) Educated Patient    Methods Explanation;Demonstration    Comprehension Verbalized understanding;Returned demonstration            PT Short Term Goals - 03/07/20 1501      PT SHORT TERM GOAL #1   Title Patient will be independent with HEP to improve foot and ankle strength and decrease pain to improve function    Baseline 03/07/20- Dependent    Time 2    Period  Weeks    Status New    Target Date 03/21/20             PT Long Term Goals - 03/07/20 1503      PT LONG TERM GOAL #1   Title Paitent will improve FOTO score to (Insert score) in order to show improved function    Baseline (Insert score)    Time 8    Period Weeks    Status New    Target Date 05/02/20      PT LONG TERM GOAL #2   Title Paitent will improve pain with first steps in morning to no more than 4/10    Baseline 02/16/20- 10/10    Time 8    Period Weeks    Status New    Target Date 05/02/20      PT LONG TERM GOAL #3   Title Paitent will be able to walk for 30 min without increase in pain to show improved function    Baseline 03/07/20- 10 min    Time 8    Period Weeks    Status New    Target Date 05/02/20                  Plan - 03/15/20 1652    Clinical Impression Statement Treatment focused on strengthening of ankle and working on balance. Patient tolerated exercises will without an increase in pain. Patient shows decreased plantarflexion strength when performing calf raises and fatigues quickly. Patient showed good ankle strategies when balancing on foam in tandem stance without an increase in pain. Patient is TTP along bilateral calf/achilles. Patien will benefit from further skilled therapy to return to PLOF    Personal Factors and Comorbidities Comorbidity 2    Comorbidities Age, obesity    Examination-Activity Limitations Stairs;Stand;Squat;Carry    Stability/Clinical Decision Making Stable/Uncomplicated    Rehab Potential Good    PT Frequency 2x / week    PT Duration 8 weeks    PT Treatment/Interventions ADLs/Self Care Home Management;Cryotherapy;Electrical Stimulation;Iontophoresis 4mg /ml Dexamethasone;Moist Heat;Ultrasound;Gait training;Stair training;Functional mobility training;Therapeutic activities;Therapeutic exercise;Neuromuscular re-education;Dry needling;Manual techniques;Passive range of motion;Joint Manipulations    PT Next Visit Plan Check balance, continue intrinsic foot strengthening    PT Home Exercise Plan Towel Curls, Foot Doming, Calf raises    Consulted and Agree with Plan of Care Patient           Patient will benefit from skilled therapeutic intervention in order to improve the following deficits and impairments:  Abnormal gait,Decreased activity tolerance,Decreased endurance,Decreased strength,Decreased range of motion,Difficulty walking,Hypomobility,Pain  Visit Diagnosis: Pain in right foot  Pain in right ankle and joints of right foot  Weakness of right foot     Problem List Patient Active Problem List   Diagnosis Date Noted  . It band syndrome, right 12/15/2018  . Obesity, Class I, BMI 30-34.9 06/26/2017  . Severe obesity (BMI 35.0-39.9) with  comorbidity (HCC) 05/14/2017  . History of sleeve gastrectomy 12/20/2015  . Postgastrectomy malabsorption 12/20/2015  . Essential hypertension 10/23/2015  . GERD (gastroesophageal reflux disease) 10/23/2015  . Hypothyroidism 10/23/2015  . History of spinal fusion 07/06/2015  . Vitamin D deficiency 07/06/2015  . Pre-operative cardiovascular examination 11/03/2012    11/05/2012 03/15/2020, 4:57 PM  05/15/2020 SPT   [place] [place] [facility] PHYSICAL AND SPORTS MEDICINE [address], [address], [place] Phone: [phone]   Fax:  [phone]  Name: AROUSH CHASSE MRN: Flint Melter Date of Birth: [birth year]

## 2020-03-21 ENCOUNTER — Other Ambulatory Visit: Payer: Self-pay

## 2020-03-21 ENCOUNTER — Ambulatory Visit: Payer: Medicaid Other

## 2020-03-21 DIAGNOSIS — M79671 Pain in right foot: Secondary | ICD-10-CM | POA: Diagnosis not present

## 2020-03-21 DIAGNOSIS — R29898 Other symptoms and signs involving the musculoskeletal system: Secondary | ICD-10-CM

## 2020-03-21 DIAGNOSIS — M25571 Pain in right ankle and joints of right foot: Secondary | ICD-10-CM

## 2020-03-21 NOTE — Therapy (Signed)
[place] [facility] PHYSICAL [facility] SPORTS MEDICINE [address] [place], [address] Phone: [phone]   Fax:  [phone]  Physical Therapy Treatment  Patient Details  Name: Ebony Reeves MRN: [id number] Date of Birth: [birth year] No data recorded  Encounter Date: 03/21/2020   PT End of Session - 03/21/20 1133    Visit Number 3    Number of Visits 17    Date for PT Re-Evaluation 05/02/20    PT Start Time 1115    PT Stop Time 1200    PT Time Calculation (min) 45 min    Activity Tolerance Patient tolerated treatment well    Behavior During Therapy [facility] for tasks assessed/performed           Past Medical History:  Diagnosis Date  . Arthritis   . 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 GERD (gastroesophageal reflux disease)   . Hypothyroidism     Past Surgical History:  Procedure Laterality Date  . ABDOMINAL EXPOSURE N/A 11/04/2012   Procedure: ABDOMINAL EXPOSURE;  Surgeon: Larina Earthly, MD;  Location: [facility] OR;  Service: Vascular;  Laterality: N/A;  . ANTERIOR AND POSTERIOR SPINAL FUSION N/A 11/04/2012   Procedure: Lumbar 4-5 anterior lumbar interbody fusion with instrumentation and allograft; Posterior spinal fusion with instrumentation;  Surgeon: Emilee Hero, MD;  Location: [facility] OR;  Service: Orthopedics;  Laterality: N/A;  Lumbar 4-5 anterior lumbar interbody fusion with instrumentation and allograft; Posterior spinal fusion with instrumentation  . CHOLECYSTECTOMY  1995   Gall Bladder  . TONSILLECTOMY      There were no vitals filed for this visit.   Subjective Assessment - 03/21/20 1119    Subjective Patient states she began a weight loss medication which has impacted her fatigue level. States she's been less active.    Limitations Standing;Walking;House hold activities    How long can you sit comfortably? Unlimited    How long can you stand comfortably? 15 min    How long can you walk comfortably? 15 min     Diagnostic tests X-Ray showed bone spur in bilateral heel    Patient Stated Goals Reduce pain, get back to walking for exercise    Currently in Pain? Yes    Pain Score 0-No pain   weight bearing 5/10   Pain Location Foot    Pain Orientation Left;Right    Pain Descriptors / Indicators Aching    Pain Type Chronic pain    Pain Onset More than a month ago    Pain Frequency Intermittent             TREATMENT  Therapeutic Exercise   Foot doming- 1x10 in standing; sitting x 10 Heel raises B LE; single LE two up one down - 2 x 10 Toe flexion with YTB - 2 x 15 Side stepping on half foam with ball of foot off - 2 x 15 Pro-stretch - 30sec x 3  Single leg step ups ~75 % of body weight - x 10  Tandem stance on airex pad - 4 x 30sec    Therapeutic exercise to improve LE strength and decrease pain      PT Education - 03/21/20 1132    Education Details form/technique with exercise    Person(s) Educated Patient    Methods Explanation;Demonstration    Comprehension Verbalized understanding;Returned demonstration            PT Short Term Goals - 03/07/20 1501  PT SHORT TERM GOAL #1   Title Patient will be independent with HEP to improve foot and ankle strength and decrease pain to improve function    Baseline 03/07/20- Dependent    Time 2    Period Weeks    Status New    Target Date 03/21/20             PT Long Term Goals - 03/16/20 0808      PT LONG TERM GOAL #1   Title Paitent will improve FOTO score to 60 in order to show improved function    Baseline 43    Time 8    Period Weeks    Status New      PT LONG TERM GOAL #2   Title Paitent will improve pain with first steps in morning to no more than 4/10    Baseline 02/16/20- 10/10    Time 8    Period Weeks    Status New      PT LONG TERM GOAL #3   Title Paitent will be able to walk for 30 min without increase in pain to show improved function    Baseline 03/07/20- 10 min    Time 8    Period Weeks     Status New                 Plan - 03/21/20 1141    Clinical Impression Statement Increased pain along plantar aspect of the foot when calf is fatigued during exercise performance during today's session. Progress heel raising exercise as she continues to have increased difficulty with performance of single leg heel raise. Will focus on progressing this through further sessions. Patient will benefit from further skilled therapy to return to prior level of function.    Personal Factors and Comorbidities Comorbidity 2    Comorbidities Age, obesity    Examination-Activity Limitations Stairs;Stand;Squat;Carry    Stability/Clinical Decision Making Stable/Uncomplicated    Rehab Potential Good    PT Frequency 2x / week    PT Duration 8 weeks    PT Treatment/Interventions ADLs/Self Care Home Management;Cryotherapy;Electrical Stimulation;Iontophoresis 4mg /ml Dexamethasone;Moist Heat;Ultrasound;Gait training;Stair training;Functional mobility training;Therapeutic activities;Therapeutic exercise;Neuromuscular re-education;Dry needling;Manual techniques;Passive range of motion;Joint Manipulations    PT Next Visit Plan Check balance, continue intrinsic foot strengthening    PT Home Exercise Plan Towel Curls, Foot Doming, Calf raises    Consulted and Agree with Plan of Care Patient           Patient will benefit from skilled therapeutic intervention in order to improve the following deficits and impairments:  Abnormal gait,Decreased activity tolerance,Decreased endurance,Decreased strength,Decreased range of motion,Difficulty walking,Hypomobility,Pain  Visit Diagnosis: Pain in right foot  Pain in right ankle and joints of right foot  Weakness of right foot     Problem List Patient Active Problem List   Diagnosis Date Noted  . It band syndrome, right 12/15/2018  . Obesity, Class I, BMI 30-34.9 06/26/2017  . Severe obesity (BMI 35.0-39.9) with comorbidity (HCC) 05/14/2017  . History of  sleeve gastrectomy 12/20/2015  . Postgastrectomy malabsorption 12/20/2015  . Essential hypertension 10/23/2015  . GERD (gastroesophageal reflux disease) 10/23/2015  . Hypothyroidism 10/23/2015  . History of spinal fusion 07/06/2015  . Vitamin D deficiency 07/06/2015  . Pre-operative cardiovascular examination 11/03/2012    11/05/2012, PT DPT 03/21/2020, 12:23 PM  [facility] PHYSICAL AND SPORTS MEDICINE [address], [address], [place] Phone: [phone]   Fax:  [phone]  Name: Ebony  KARRIN Reeves MRN: [id number] Date of Birth: [birth year]

## 2020-03-22 ENCOUNTER — Encounter: Payer: Self-pay | Admitting: Podiatry

## 2020-03-22 ENCOUNTER — Ambulatory Visit (INDEPENDENT_AMBULATORY_CARE_PROVIDER_SITE_OTHER): Payer: Medicaid Other | Admitting: Podiatry

## 2020-03-22 DIAGNOSIS — M722 Plantar fascial fibromatosis: Secondary | ICD-10-CM | POA: Diagnosis not present

## 2020-03-22 DIAGNOSIS — M7662 Achilles tendinitis, left leg: Secondary | ICD-10-CM | POA: Diagnosis not present

## 2020-03-22 NOTE — Progress Notes (Signed)
  Subjective:  Patient ID: Ebony Reeves, female    DOB: August 02, 1965,  MRN: [id number]  Chief Complaint  Patient presents with  . Tendonitis    "its not much better.  The injection did nothing for my foot, but Im in therapy and it is helping along with wearing the boot at night and stretching"    55 y.o. female returns with the above complaint. History confirmed with patient.  Injection did not help at all.  She did start physical therapy albeit late.  She has only had 4 sessions.  She is getting ready to go to [place] for several weeks to take care of her friend who is having surgery.  She has been given a home exercise plan for this while she is gone  Objective:  Physical Exam: warm, good capillary refill, no trophic changes or ulcerative lesions, normal DP and PT pulses and normal sensory exam. Left Foot: tenderness at Achilles tendon insertion  Right Foot: point tenderness over the heel pad   No images are attached to the encounter.  Radiographs: X-ray of both feet: No fracture dislocation noted, there is a posterior calcaneal enthesophyte on the left side and a plantar calcaneal enthesophyte that is quite small on the right side Assessment:   No diagnosis found.   Plan:  Patient was evaluated and treated and all questions answered.  Discussed the etiology and treatment options for plantar fasciitis and Achilles tendinitis including stretching, formal physical therapy, supportive shoegears such as a running shoe or sneaker, pre fabricated orthoses, injection therapy, and oral medications. We also discussed the role of surgical treatment of this for patients who do not improve after exhausting non-surgical treatment options.   Plantar Fasciitis and Achilles tendinitis -Educated patient on stretching and icing of the affected limb, advised that she continue this while she is on her trip and doing home exercise plan that her therapist has given her -No injection given, last one  did not help -Not able to take NSAIDs due to previous stomach surgery, she prefer to avoid prednisone taper as well because of her current weight loss.  We discussed that we will be limited and what sorts of things we can take to treat this and this may be a prolonged recovery. -Continue physical therapy and she returns -I would like to see her back in 1 month after she returns from her trip    Return in about 2 months (around 05/22/2020).

## 2020-05-25 ENCOUNTER — Other Ambulatory Visit: Payer: Self-pay | Admitting: Primary Care

## 2020-05-25 DIAGNOSIS — Z1231 Encounter for screening mammogram for malignant neoplasm of breast: Secondary | ICD-10-CM

## 2021-06-19 ENCOUNTER — Other Ambulatory Visit: Payer: Self-pay | Admitting: Primary Care

## 2021-06-19 DIAGNOSIS — Z1231 Encounter for screening mammogram for malignant neoplasm of breast: Secondary | ICD-10-CM
